# Patient Record
Sex: Male | Born: 1935 | Race: White | Hispanic: No | Smoking: Former smoker
Health system: Southern US, Community
[De-identification: ages and names within clinical notes are randomized; demographics above are authoritative.]

## PROBLEM LIST (undated history)

## (undated) DIAGNOSIS — E7800 Pure hypercholesterolemia, unspecified: Secondary | ICD-10-CM

## (undated) DIAGNOSIS — I4891 Unspecified atrial fibrillation: Secondary | ICD-10-CM

## (undated) DIAGNOSIS — N183 Chronic kidney disease, stage 3 unspecified: Secondary | ICD-10-CM

## (undated) DIAGNOSIS — R918 Other nonspecific abnormal finding of lung field: Secondary | ICD-10-CM

## (undated) HISTORY — DX: Unspecified atrial fibrillation: I48.91

## (undated) HISTORY — DX: Pure hypercholesterolemia, unspecified: E78.00

## (undated) HISTORY — DX: Other nonspecific abnormal finding of lung field: R91.8

## (undated) HISTORY — DX: Chronic kidney disease, stage 3 unspecified: N18.30

## (undated) HISTORY — DX: Chronic kidney disease, stage 3 (moderate): N18.3

---
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
## 2014-10-09 ENCOUNTER — Encounter: Payer: Self-pay | Admitting: Cardiology

## 2014-10-30 ENCOUNTER — Other Ambulatory Visit: Payer: Self-pay | Admitting: Geriatric Medicine

## 2014-10-30 DIAGNOSIS — R918 Other nonspecific abnormal finding of lung field: Secondary | ICD-10-CM

## 2014-11-03 ENCOUNTER — Ambulatory Visit
Admission: RE | Admit: 2014-11-03 | Discharge: 2014-11-03 | Disposition: A | Discharge status: Home / Self Care | Payer: Medicare Other | Source: Ambulatory Visit | Attending: Geriatric Medicine | Admitting: Geriatric Medicine

## 2014-11-03 DIAGNOSIS — R918 Other nonspecific abnormal finding of lung field: Secondary | ICD-10-CM

## 2014-11-07 ENCOUNTER — Encounter: Payer: Self-pay | Admitting: Pulmonary Disease

## 2014-11-10 ENCOUNTER — Encounter: Payer: Self-pay | Admitting: Pulmonary Disease

## 2014-11-10 ENCOUNTER — Ambulatory Visit (INDEPENDENT_AMBULATORY_CARE_PROVIDER_SITE_OTHER): Payer: Medicare Other | Admitting: Pulmonary Disease

## 2014-11-10 ENCOUNTER — Other Ambulatory Visit (INDEPENDENT_AMBULATORY_CARE_PROVIDER_SITE_OTHER): Payer: Medicare Other

## 2014-11-10 VITALS — BP 120/64 | HR 55 | Temp 97.6°F | Ht 69.0 in | Wt 168.2 lb

## 2014-11-10 DIAGNOSIS — R339 Retention of urine, unspecified: Secondary | ICD-10-CM | POA: Diagnosis not present

## 2014-11-10 DIAGNOSIS — J984 Other disorders of lung: Secondary | ICD-10-CM | POA: Diagnosis not present

## 2014-11-10 DIAGNOSIS — R918 Other nonspecific abnormal finding of lung field: Secondary | ICD-10-CM | POA: Insufficient documentation

## 2014-11-10 LAB — CREATININE, SERUM: Creatinine, Ser: 1.58 mg/dL — ABNORMAL HIGH (ref 0.40–1.50)

## 2014-11-10 LAB — BUN: BUN: 26 mg/dL — ABNORMAL HIGH (ref 6–23)

## 2014-11-10 NOTE — Patient Instructions (Signed)
1. Checking a whole-body PET/CT scan to evaluate for possible sources of cancer. 2. Checking a serum PSA for a possible prostate cancer 3. Checking urine studies for possible fungal infections from where you've lived 4. I will contact your son with the results of your studies. He will return to clinic in 4 weeks or sooner if needed.

## 2014-11-10 NOTE — Progress Notes (Signed)
Subjective:    Patient ID: Ronald Ochoa, male    DOB: 12/21/1920, 81 y.o.   MRN: [id number]  HPI He reports he has never been told he had any discussion about an abnormal CXR.  Reportedly he had a CXR as a screening annual exam prior to coming to [place] to [place] and was found to have an abnormal exam.  He moved to [facility] before a biopsy could be performed.  He denies any chest pain or discomfort.  He reports feeling "dizzy" on standing.  Low blood pressure has been a problem and he is on Florinef. He denies any dyspnea or coughing. He denies any fever, chills, or sweats. He may have lost 7 pounds since May of this year. He reports early satiety but denies any odynophagia or dysphagia. No nausea, emesis or abdominal pain.  No melena or hematochezia. No adenopathy in his neck, groin, or axilla. He denies any headaches. No focal vision loss, weakness, numbness, or tingling.    Review of Systems He denies urinary hesitancy, dysuria or hematuria but does go frequently. No rashes but does bruise easily, particularly on his hands. A pertinent 14 point review of systems is negative except as per the history of presenting illness.  No Known Allergies  Current Outpatient Prescriptions on File Prior to Visit  Medication Sig Dispense Refill  . amiodarone (PACERONE) 200 MG tablet Take 200 mg by mouth daily.    . fludrocortisone (FLORINEF) 0.1 MG tablet Take 0.1 mg by mouth daily.    . pravastatin (PRAVACHOL) 20 MG tablet Take 20 mg by mouth daily.     No current facility-administered medications on file prior to visit.   Past Medical History  Diagnosis Date  . Atrial fibrillation   . Hypercholesterolemia   . Chronic renal failure, stage 3 (moderate)   . Multiple lung nodules on CT    Past Surgical History  Procedure Laterality Date  . Appendectomy     Family History  Problem Relation Age of Onset  . CAD Father   . Cancer Mother   . Colon cancer Sister   . Liver cancer Daughter   .  Hepatitis C Daughter   . Melanoma Son    History   Social History  . Marital Status: Widowed    Spouse Name: N/A  . Number of Children: Y  . Years of Education: N/A   Occupational History  . retired    Social History Main Topics  . Smoking status: Former Smoker -- 0.50 packs/day for 15 years    Types: Cigarettes    Quit date: 04/18/1953  . Smokeless tobacco: Not on file  . Alcohol Use: No  . Drug Use: No  . Sexual Activity: Not on file   Other Topics Concern  . None   Social History Narrative   Originally from [place], [place]. He has lived in [place], [place], [place], [place],  & [place]. Has previously traveled to [place], [place], [place], & [place]. 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 He currently lives at [facility]. Previously had dogs. He denies any bird or mold exposure. No hot tub exposure. 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        Objective:   Physical Exam Blood pressure 120/64, pulse 55, temperature 97.6 F (36.4 C), temperature source Oral, height '5\' 9"'$  (1.753 m), weight 168 lb 3.2 oz (76.295 kg), SpO2 97 %. General:  Awake. Alert. No acute distress. Accompanied by son  today.  Integument:  Warm & dry. No rash on exposed skin. Mild bruising on dorsum of bilateral hands. Lymphatics:  No appreciated cervical or supraclavicular lymphadenoapthy. HEENT:  Moist mucus membranes. No oral ulcers. No scleral injection or icterus. PERRL. Cardiovascular:  Regular rate. No edema. No appreciable JVD.  Pulmonary:  Good aeration & clear to auscultation bilaterally. Symmetric chest wall expansion. No accessory muscle use. Abdomen: Soft. Normal bowel sounds. Nondistended. Grossly nontender. Musculoskeletal:  Normal bulk and tone. Hand grip strength 5/5 bilaterally. No joint deformity or effusion appreciated. Neurological:  CN 2-12 grossly in tact. No meningismus. Moving all 4 extremities equally. Symmetric patellar deep tendon  reflexes. Psychiatric:  Mood and affect congruent. Speech normal rhythm, rate & tone.   IMAGING CT CHEST W/O 11/03/14 (personally reviewed by me): Spiculated left upper lobe nodule and right upper lobe mass with extension to the right lower lobe. There are also bronchograms noted within the opacification suggesting some element of consolidation. Other pulmonary nodules noted bilaterally. Incompletely imaged right adrenal nodule. No appreciable pathologic mediastinal adenopathy. No pleural effusion appreciated. Left upper lobe nodule does extend to the pleura. No lytic bone lesions appreciated.    Assessment & Plan:  81 year old Caucasian male with bilateral pulmonary nodules and a right upper lobe mass extending into the right lower lobe. There is some consolidation to the right upper lobe/lower lobe mass as well with air bronchograms present. I did spend a significant amount from today reviewing the chest CT image with the patient and his son who was also present. We also spent a lengthy amount of time discussing the possible etiologies for his lung nodule/mass including Histoplasma, Blastomyces, and metastatic cancer. I do not favor of primary lung malignancy given the lack of pathologic mediastinal adenopathy. Certainly malignancy is of high probability given his advanced age and multiple family members with various malignancies. The patient expressed the wish that his son be contacted with the results first to assist in helping him make further decisions. I attempted to reassure the patient that we will not perform any procedures that he does not expressly consent to.  1. Lung nodules/right upper lobe mass: Checking urine Histoplasma and Blastomyces antigens. Also checking serum PSA. Checking PET CT scan to evaluate for possible primary source of malignancy and biopsy site. 2. Urinary frequency: Checking total and free PSA. 3. Follow-up: Patient will return to clinic in 4 weeks or sooner if needed.

## 2014-11-11 LAB — PSA, TOTAL AND FREE
PSA, Free Pct: 28 % (ref >25)
PSA, Free: 0.34 ng/mL
PSA: 1.21 ng/mL (ref <=4.00)

## 2014-11-19 ENCOUNTER — Ambulatory Visit (HOSPITAL_COMMUNITY)
Admission: RE | Admit: 2014-11-19 | Discharge: 2014-11-19 | Disposition: A | Discharge status: Home / Self Care | Payer: Medicare Other | Source: Ambulatory Visit | Attending: Pulmonary Disease | Admitting: Pulmonary Disease

## 2014-11-19 DIAGNOSIS — R918 Other nonspecific abnormal finding of lung field: Secondary | ICD-10-CM | POA: Diagnosis not present

## 2014-11-19 DIAGNOSIS — Z79899 Other long term (current) drug therapy: Secondary | ICD-10-CM | POA: Diagnosis not present

## 2014-11-19 DIAGNOSIS — C801 Malignant (primary) neoplasm, unspecified: Secondary | ICD-10-CM | POA: Insufficient documentation

## 2014-11-19 DIAGNOSIS — I700 Atherosclerosis of aorta: Secondary | ICD-10-CM | POA: Insufficient documentation

## 2014-11-19 DIAGNOSIS — C7801 Secondary malignant neoplasm of right lung: Secondary | ICD-10-CM | POA: Insufficient documentation

## 2014-11-19 DIAGNOSIS — C7951 Secondary malignant neoplasm of bone: Secondary | ICD-10-CM | POA: Diagnosis not present

## 2014-11-19 DIAGNOSIS — C7971 Secondary malignant neoplasm of right adrenal gland: Secondary | ICD-10-CM | POA: Insufficient documentation

## 2014-11-19 DIAGNOSIS — C7802 Secondary malignant neoplasm of left lung: Secondary | ICD-10-CM | POA: Diagnosis not present

## 2014-11-19 LAB — GLUCOSE, CAPILLARY: GLUCOSE-CAPILLARY: 78 mg/dL (ref 65–99)

## 2014-11-19 LAB — HISTOPLASMA ANTIGEN, URINE: Histoplasma Antigen, urine: <0.5 ng/mL

## 2014-11-19 MED ORDER — FLUDEOXYGLUCOSE F - 18 (FDG) INJECTION
9.2000 | Freq: Once | INTRAVENOUS | Status: AC | PRN
  Administered 2014-11-19: 9.2 via INTRAVENOUS

## 2014-11-21 ENCOUNTER — Telehealth: Payer: Self-pay | Admitting: Pulmonary Disease

## 2014-11-21 NOTE — Telephone Encounter (Signed)
Spoke with the patient's son Ronald Ochoa today regarding the results of the PET CT scan. This appears to be a widely metastatic malignancy on PET/CT imaging involving not only the lumbar vertebral spine but also a lesion within the abdomen and multiple hypermetabolic areas within the chest. We had a very frank discussion about the patient's potential survival and curability if this is truly cancer and the patient's son does not feel that he would wish to be aggressive. If the patient is not willing to consider even palliative chemotherapy I do not feel that subjecting him to a biopsy and the potential comorbidities and complications of biopsy is necessary. The patient's son is going to discuss this further with him and then contact my office regarding his wishes. We did briefly discuss performing a brain MRI to evaluate for possible cerebral metastases not appreciated on PET/CT imaging. We also discussed palliative radiation therapy for symptomatic relief. Presently he does have an appointment scheduled in September but will let us know if his father wishes to move this appointment forward to discuss the results and potential options.

## 2014-11-25 LAB — MVISTA BLASTOMYCES QNT AG, URINE
Interpretation: NEGATIVE
Result (ng/ml): NOT DETECTED

## 2014-11-26 ENCOUNTER — Telehealth: Payer: Self-pay | Admitting: Pulmonary Disease

## 2014-11-26 DIAGNOSIS — C7951 Secondary malignant neoplasm of bone: Secondary | ICD-10-CM

## 2014-11-26 NOTE — Telephone Encounter (Signed)
Patients son spoke with Dr. Ashok Cordia regarding PET scan.  Patients son said that they have decided that they would like to go ahead with a referral for radiation consultation for the spine in order to help with comfort in the spine.  Patient's son wants to know if he still needs to bring father back on 9/1.

## 2014-11-26 NOTE — Telephone Encounter (Signed)
Called spoke with son and is aware of below. Referral placed. They will keep appt for now. Nothing further needed

## 2014-11-26 NOTE — Telephone Encounter (Signed)
Please put in a consult to Radiation Oncology for possible palliative treatment to spinal metastasis. As far as his appointment on 9/1 please call the patient's son and tell him I'd be happy to see him back on an as needed basis if they'd prefer. If if they want to keep the appointment on 9/1 to answer any questions his father may have after they meet with Radiation Oncology I'm fine with that too. Thanks.

## 2014-12-01 ENCOUNTER — Ambulatory Visit: Payer: Self-pay | Admitting: Cardiology

## 2014-12-02 ENCOUNTER — Ambulatory Visit (INDEPENDENT_AMBULATORY_CARE_PROVIDER_SITE_OTHER): Payer: Medicare Other | Admitting: Cardiology

## 2014-12-02 ENCOUNTER — Encounter: Payer: Self-pay | Admitting: Cardiology

## 2014-12-02 VITALS — BP 140/64 | HR 55 | Ht 66.0 in | Wt 165.5 lb

## 2014-12-02 DIAGNOSIS — R011 Cardiac murmur, unspecified: Secondary | ICD-10-CM | POA: Diagnosis not present

## 2014-12-02 DIAGNOSIS — Z79899 Other long term (current) drug therapy: Secondary | ICD-10-CM | POA: Diagnosis not present

## 2014-12-02 DIAGNOSIS — I480 Paroxysmal atrial fibrillation: Secondary | ICD-10-CM

## 2014-12-02 DIAGNOSIS — I951 Orthostatic hypotension: Secondary | ICD-10-CM

## 2014-12-02 MED ORDER — AMIODARONE HCL 200 MG PO TABS: 200.0000 mg | ORAL_TABLET | Freq: Every day | ORAL | Status: AC

## 2014-12-02 NOTE — Patient Instructions (Signed)
Medication Instructions:  Your physician recommends that you continue on your current medications as directed. Please refer to the Current Medication list given to you today.  Follow-Up: Follow up in 6 months with Dr. Marlou Porch.  You will receive a letter in the mail 2 months before you are due.  Please call us when you receive this letter to schedule your follow up appointment.  Thank you for choosing [facility]!!

## 2014-12-02 NOTE — Progress Notes (Signed)
Cardiology Office Note   Date:  12/02/2014   ID:  Ronald Ochoa, DOB 1921-02-02, MRN [id number]  PCP:  [name], MD  Cardiologist:   [name], MD       History of Present Illness: Ronald Ochoa is a 81 y.o. male who presents for evaluation of atrial fibrillation, paroxysmal. Has been taking amiodarone for several years. Also has orthostatic hypotension for which he takes Florinef. Has a lung nodule.  Hemoglobin 12.2, creatinine 1.5, BUN 27, sodium 144, potassium 4.1, ALT 15, LDL 64, TSH 1.2  He moved [facility] [place] from [facility]. His son [name] lives in [place]. He has been on amiodarone for quite some time every now and then forgets to take the pill. Has occasional ankle edema. He lives at [facility]    Past Medical History  Diagnosis Date  . Atrial fibrillation   . Hypercholesterolemia   . Chronic renal failure, stage 3 (moderate)   . Multiple lung nodules on CT     Past Surgical History  Procedure Laterality Date  . Appendectomy       Current Outpatient Prescriptions  Medication Sig Dispense Refill  . amiodarone (PACERONE) 200 MG tablet Take 200 mg by mouth daily.    . fludrocortisone (FLORINEF) 0.1 MG tablet Take 0.1 mg by mouth daily.    . pravastatin (PRAVACHOL) 20 MG tablet Take 20 mg by mouth daily.     No current facility-administered medications for this visit.    Allergies:   Review of patient's allergies indicates no known allergies.    Social History:  The patient  reports that he quit smoking about 61 years ago. His smoking use included Cigarettes. He has a 7.5 pack-year smoking history. He does not have any smokeless tobacco history on file. He reports that he does not drink alcohol or use illicit drugs.   Family History:  The patient's family history includes CAD in his father; Cancer in his mother; Colon cancer in his sister; Hepatitis C in his daughter; Liver cancer in his daughter; Melanoma in his son.    ROS:   Please see the history of present illness.   Otherwise, review of systems are positive for unsteady gait, occasional fatigue. Denies chest pain, orthopnea, syncope, bleeding.  All other systems are reviewed and negative.    PHYSICAL EXAM: VS:  BP 140/64 mmHg  Pulse 55  Ht '5\' 6"'$  (1.676 m)  Wt 165 lb 8 oz (75.07 kg)  BMI 26.73 kg/m2 , BMI Body mass index is 26.73 kg/(m^2). GEN: Elderly, ambulates with a walker in no acute distress HEENT: normal Neck: no JVD, carotid bruits, or masses Cardiac: RRR; 2/6 systolic murmur right upper sternal border, no rubs, or gallops,no edema  Respiratory:  clear to auscultation bilaterally, normal work of breathing GI: soft, nontender, nondistended, + BS MS: no deformity or atrophy Skin: warm and dry, no rash Neuro:  Strength and sensation are intact Psych: euthymic mood, full affect   EKG:  EKG is ordered today. The ekg ordered today demonstrates today, 12/02/14-sinus bradycardia rate 55 with first-degree AV block, PR interval 226 ms   Recent Labs: 11/10/2014: BUN 26*; Creatinine, Ser 1.58*    Lipid Panel No results found for: CHOL, TRIG, HDL, CHOLHDL, VLDL, LDLCALC, LDLDIRECT    Wt Readings from Last 3 Encounters:  12/02/14 165 lb 8 oz (75.07 kg)  11/10/14 168 lb 3.2 oz (76.295 kg)      Other studies Reviewed: Additional studies/ records that were reviewed today include:  Office notes, lab work, EKG reviewed. Review of the above records demonstrates: As above   ASSESSMENT AND PLAN:  1.  Paroxysmal atrial fibrillation  - CHADS-VASc at least 2 (age). No prior stroke. He is not hypertensive.  - He has not had any recent episodes of atrial fibrillation. His prior cardiologist in [facility] has been maintaining sinus rhythm with amiodarone 200 mg a day. We will continue with this. We discussed the possibility of anticoagulation and at this time we will forego. Understands the stroke risk if atrial fibrillation were to return. He is not  interested at this time on anticoagulation. He is at increased risk for falls. Increased risk for bleeding given advanced age. His son was present for discussion, [name].  2. Orthostatic hypotension  - Currently on Florinef. Doing well.  - He knows to get up slowly  3. Chronic amiodarone use  - Dr. Felipa Eth sent me his lab work from 09/11/14 including LFTs, TSH all of which were normal. Reassurance. No evidence of pulmonary fibrosis.  4. Heart murmur  - Systolic murmur appreciated. Had lengthy discussion with he and son. We will continue to monitor clinically instead of pursuing echocardiogram as this will not likely change management at this point.   Current medicines are reviewed at length with the patient today.  The patient does not have concerns regarding medicines.  The following changes have been made:  no change  Labs/ tests ordered today include: None  No orders of the defined types were placed in this encounter.     Disposition:   FU with [facility] in 6 months  Signed, [name], MD  12/02/2014 12:16 PM    [facility] [place], [place], [place]  [address] Phone: [phone]; Fax: [phone]

## 2014-12-03 NOTE — Progress Notes (Addendum)
Histology and Location of Primary Cancer:Bilateral pulmonary nodules and right upper lobe mass  11/03/14:anterior osteophytosis in the thoracic spine.    PET 8/IMPRESSION: 1. Central right midlung mass is intensely hypermetabolic compatible with primary bronchogenic carcinoma. Assuming a non-small still histology this would be consistent with a T2bN0M1b or stage IV disease. 2. Bilateral pulmonary metastasis 3. Right adrenal gland metastasis 4. Lytic metastasis to the L2 vertebra.3/16    CT Chest IMPRESSION: 1. Right upper lobe mass right upper/lower lobe mass and spiculated left upper lobe nodule, highly worrisome for synchronous primary bronchogenic carcinomas. 2. Multiple scattered bilateral pulmonary nodules may be metastatic. 3. Right adrenal nodule is incompletely imaged and worrisome for metastatic disease. 4. Extensive 3 vessel coronary artery calcification.  Sites of Visceral and Bony Metastatic Disease:   Location(s) of Symptomatic Metastases:   Past/Anticipated chemotherapy by medical oncology, if any:   Pain on a scale of 0-10 is: No   If Spine Met(s), symptoms, if any, include:  Bowel/Bladder retention or incontinence :No  Numbness or weakness in extremities No  Current Decadron regimen, if applicable:No   Ambulatory status? Walker? Wheelchair?: Uses walker Quit smoking on 04/18/1953 after 15 year history 1/2 ppd over 15 years. No alcohol.  SAFETY ISSUES:  Prior radiation? No  Pacemaker/ICD? No  Possible current pregnancy? N/A  Is the patient on methotrexate? No  Current Complaints / other details:single.Resides at [facility].Retired from [employer].Here with son [name].  BP 149/53 mmHg  Pulse 55  Temp(Src) 98.1 F (36.7 C)  Resp 20  Wt 164 lb 6.4 oz (74.571 kg)  SpO2 98%

## 2014-12-04 ENCOUNTER — Ambulatory Visit
Admission: RE | Admit: 2014-12-04 | Discharge: 2014-12-04 | Disposition: A | Discharge status: Home / Self Care | Payer: Medicare Other | Source: Ambulatory Visit | Attending: Radiation Oncology | Admitting: Radiation Oncology

## 2014-12-04 ENCOUNTER — Encounter: Payer: Self-pay | Admitting: Radiation Oncology

## 2014-12-04 VITALS — BP 149/53 | HR 55 | Temp 98.1°F | Resp 20 | Wt 164.4 lb

## 2014-12-04 DIAGNOSIS — M545 Low back pain: Secondary | ICD-10-CM | POA: Diagnosis not present

## 2014-12-04 DIAGNOSIS — Z7982 Long term (current) use of aspirin: Secondary | ICD-10-CM | POA: Diagnosis not present

## 2014-12-04 DIAGNOSIS — C3490 Malignant neoplasm of unspecified part of unspecified bronchus or lung: Secondary | ICD-10-CM | POA: Diagnosis present

## 2014-12-04 DIAGNOSIS — R918 Other nonspecific abnormal finding of lung field: Secondary | ICD-10-CM

## 2014-12-04 NOTE — Progress Notes (Signed)
Please see the Nurse Progress Note in the MD Initial Consult Encounter for this patient. 

## 2014-12-04 NOTE — Progress Notes (Signed)
Radiation Oncology         [phone]) [phone] ________________________________  Initial outpatient Consultation - Date: 12/04/2014   Name: Ronald Ochoa MRN: [id number]   DOB: 1920/04/29  REFERRING PHYSICIAN: Javier Glazier, MD  DIAGNOSIS AND STAGE: 81 yo gentleman with presumed metastatic non small cell lung cancer.  HISTORY OF PRESENT ILLNESS::Ronald Ochoa is a 81 y.o. male who moved here to be closer to his son. Upon moving into his assisted living facility he had a TB test that was positive. This prompted a chest X-ray which showed an abnormality in the right lung. This prompted a chest CT which confirmed a 6 cm right lung mass, as well as multiple other bilateral lung nodules concerning for metastatic disease. A PET scan was performed on 11/19/14 which showed uptake in the right lung mass as well as in the other lung nodules and in the bilateral adrenal glands. A hypermetabolic lytic lesion was seen in the L2 region. After discussion with Dr. Ashok Cordia the patient and his son decided not to proceed with biopsy. He was referred to me with consideration of palliative radiation to this area of bony disease. He lives in assisted here in [place]. Per his son, his short term memory is poor. He does perform most of his activities of daily living. He walks with a walker. He denies any hemoptysis or chest pain. His breathing symptoms are stable. He presents with new onset low back pain. This does not interfere with sleep or movement. He currently takes aspirin which controls this pain.  Past medical, social and family history were reviewed in the electronic chart. Review of symptoms was reviewed in the electronic chart. Medications were reviewed in the electronic chart.   PHYSICAL EXAM:  Filed Vitals:   12/04/14 1039  BP: 149/53  Pulse: 55  Temp: 98.1 F (36.7 C)  Resp: 20  .164 lb 6.4 oz (74.571 kg). Pleasant elderly male in no distress. He has tenderness to palpation over the L2 vertebral body.  He has no focal weakness of his upper or lower extremities. His cranial nerves are intact.    IMPRESSION: Likely metastatic non small cell lung cancer  PLAN: I spoke to the patient and his son today. He has declined biopsy, which I think is reasonable. He is currently having minimal pain at L2 which he controls with aspirin. He is able to sleep at night and it does not interfere with movement. Given his short term memory issues and general medical function as well as his lack of severe pain, I think I would hold off now on treatment at this time. Radiation treatment to this area could cause nausea and vomiting, as well as possible diarrhea which he may not tolerate well. In addition mobility seems to be an issue and daily radiation treatment may take its toll on him. I have asked his son to keep track of his pain levels and report back to me if his pain is no longer controlled by aspirin. At this point we can initiate with radiation. He has follow up scheduled with Dr. Ashok Cordia September 1st. I will plan on seeing him back on an as needed basis.    I spent 40 minutes  face to face with the patient and more than 50% of that time was spent in counseling and/or coordination of care.   This document serves as a record of services personally performed by Thea Silversmith, MD. It was created on her behalf by Arlyce Harman, a trained medical scribe.  The creation of this record is based on the scribe's personal observations and the provider's statements to them. This document has been checked and approved by the attending provider.    ------------------------------------------------  Thea Silversmith, MD

## 2014-12-18 ENCOUNTER — Ambulatory Visit: Payer: PRIVATE HEALTH INSURANCE | Admitting: Pulmonary Disease

## 2015-06-25 ENCOUNTER — Encounter: Payer: Self-pay | Admitting: Cardiology

## 2015-06-25 ENCOUNTER — Ambulatory Visit (INDEPENDENT_AMBULATORY_CARE_PROVIDER_SITE_OTHER): Payer: PPO | Admitting: Cardiology

## 2015-06-25 VITALS — BP 144/68 | HR 58 | Ht 69.0 in | Wt 151.4 lb

## 2015-06-25 DIAGNOSIS — Z79899 Other long term (current) drug therapy: Secondary | ICD-10-CM | POA: Diagnosis not present

## 2015-06-25 DIAGNOSIS — I480 Paroxysmal atrial fibrillation: Secondary | ICD-10-CM

## 2015-06-25 NOTE — Patient Instructions (Signed)
Medication Instructions:  The current medical regimen is effective;  continue present plan and medications.  Follow-Up: Follow up as needed with Dr Marlou Porch.  If you need a refill on your cardiac medications before your next appointment, please call your pharmacy.  Thank you for choosing [facility]!!

## 2015-06-25 NOTE — Progress Notes (Signed)
Cardiology Office Note   Date:  06/25/2015   ID:  Ronald Ochoa, DOB 1920-05-18, MRN [id number]  PCP:  Ronald Argyle, MD  Cardiologist:   Ronald Furbish, MD       History of Present Illness: Ronald Ochoa is a 82 y.o. male who presents for evaluation of atrial fibrillation, paroxysmal. Has been taking amiodarone for several years. Also has orthostatic hypotension for which he takes Florinef. Has a lung nodule.  Overall no change since last visit. He has been maintaining sinus rhythm with amiodarone for several years it sounds like. Not interested in anticoagulation.  Hemoglobin 12.2, creatinine 1.5, BUN 27, sodium 144, potassium 4.1, ALT 15, LDL 64, TSH 1.2, previously  [place] from [place]. His son Ronald Ochoa lives in [place]. He has been on amiodarone for quite some time now  He lives at [facility]    Past Medical History  Diagnosis Date  . Atrial fibrillation ([place]cho[place])   . Hypercholesterolemia   . Chronic renal failure, stage 3 (moderate)   . Multiple lung nodules on CT     Past Surgical History  Procedure Laterality Date  . Appendectomy  1943     Current Outpatient Prescriptions  Medication Sig Dispense Refill  . amiodarone (PACERONE) 200 MG tablet Take 1 tablet (200 mg total) by mouth daily. 90 tablet 3  . fludrocortisone (FLORINEF) 0.1 MG tablet Take 0.1 mg by mouth daily.    . pravastatin (PRAVACHOL) 20 MG tablet Take 20 mg by mouth daily.     No current facility-administered medications for this visit.    Allergies:   Review of patient's allergies indicates no known allergies.    Social History:  The patient  reports that he quit smoking about 62 years ago. His smoking use included Cigarettes. He has a 7.5 pack-year smoking history. He does not have any smokeless tobacco history on file. He reports that he does not drink alcohol or use illicit drugs.   Family History:  The patient's family history includes CAD in his father;  Cancer in his mother; Colon cancer in his sister; Hepatitis C in his daughter; Liver cancer in his daughter; Melanoma in his son.    ROS:  Please see the history of present illness.   Otherwise, review of systems are positive for unsteady gait, occasional fatigue. Denies chest pain, orthopnea, syncope, bleeding.  All other systems are reviewed and negative.    PHYSICAL EXAM: VS:  BP 144/68 mmHg  Pulse 58  Ht '5\' 9"'$  (1.753 m)  Wt 151 lb 6.4 oz (68.675 kg)  BMI 22.35 kg/m2 , BMI Body mass index is 22.35 kg/(m^2). GEN: Elderly, ambulates with a walker in no acute distressThin HEENT: normal Neck: no JVD, carotid bruits, or masses Cardiac: RRR; 2/6 systolic murmur right upper sternal border, no rubs, or gallops,no edema  Respiratory:  clear to auscultation bilaterally, normal work of breathing GI: soft, nontender, nondistended, + BS MS: no deformity or atrophy Skin: warm and dry, no rash Neuro:  Strength and sensation are intact Psych: euthymic mood, full affect   EKG:   12/02/14-sinus bradycardia rate 55 with first-degree AV block, PR interval 226 ms   Recent Labs: 11/10/2014: BUN 26*; Creatinine, Ser 1.58*    Lipid Panel No results found for: CHOL, TRIG, HDL, CHOLHDL, VLDL, LDLCALC, LDLDIRECT    Wt Readings from Last 3 Encounters:  06/25/15 151 lb 6.4 oz (68.675 kg)  12/04/14 164 lb 6.4 oz (74.571 kg)  12/02/14 165 lb 8  oz (75.07 kg)      Other studies Reviewed: Additional studies/ records that were reviewed today include: Office notes, lab work, EKG reviewed. Review of the above records demonstrates: As above   ASSESSMENT AND PLAN:  1.  Paroxysmal atrial fibrillation  - CHADS-VASc at least 2 (age). No prior stroke. He is not hypertensive.  - He has not had any recent episodes of atrial fibrillation. His prior cardiologist in [facility] has been maintaining sinus rhythm with amiodarone 200 mg a day. We will continue with this. We discussed the possibility of  anticoagulation and at this time we will forego. Understands the stroke risk if atrial fibrillation were to return. He is not interested at this time on anticoagulation. He is at increased risk for falls. Increased risk for bleeding given advanced age. His son was present for discussion, Ronald Ochoa.  2. Orthostatic hypotension  - Currently on Florinef. Doing well.  - He knows to get up slowly  3. Chronic amiodarone use  - Dr. Felipa Ochoa Watching lab work from including LFTs, TSH all of which were normal. Reassurance. No evidence of pulmonary fibrosis.  4. Heart murmur  - Systolic murmur appreciated. Had lengthy discussion with he and son. We will continue to monitor clinically instead of pursuing echocardiogram as this will not likely change management at this point.  At this point his son would like to just maintain follow-up with Dr. Felipa Ochoa which seems reasonable. I explained to him that if Dr. Carlyle Ochoa office did not wish to continue to prescribe amiodarone that he would need to come back on a yearly basis at least to have his checkup.   Current medicines are reviewed at length with the patient today.  The patient does not have concerns regarding medicines.  The following changes have been made:  no change  Labs/ tests ordered today include: None  No orders of the defined types were placed in this encounter.     Disposition:   FU with Ronald Ochoa in PRN basis.   Ronald Rumpf, MD  06/25/2015 11:04 AM    [address], [place], [place]  [address] Phone: [phone]; Fax: [phone]

## 2015-08-08 ENCOUNTER — Inpatient Hospital Stay (HOSPITAL_COMMUNITY): Payer: PPO | Admitting: Anesthesiology

## 2015-08-08 ENCOUNTER — Inpatient Hospital Stay (HOSPITAL_COMMUNITY): Payer: PPO

## 2015-08-08 ENCOUNTER — Emergency Department (HOSPITAL_COMMUNITY): Payer: PPO

## 2015-08-08 ENCOUNTER — Encounter (HOSPITAL_COMMUNITY)
Admission: EM | Disposition: A | Discharge status: Skilled Nursing Facility | Payer: Self-pay | Source: Home / Self Care | Attending: Internal Medicine

## 2015-08-08 ENCOUNTER — Encounter (HOSPITAL_COMMUNITY): Payer: Self-pay | Admitting: Emergency Medicine

## 2015-08-08 ENCOUNTER — Inpatient Hospital Stay (HOSPITAL_COMMUNITY)
Admission: EM | Admit: 2015-08-08 | Discharge: 2015-08-10 | DRG: 470 | Disposition: A | Discharge status: Skilled Nursing Facility | Payer: PPO | Attending: Internal Medicine | Admitting: Internal Medicine

## 2015-08-08 DIAGNOSIS — S72002A Fracture of unspecified part of neck of left femur, initial encounter for closed fracture: Secondary | ICD-10-CM

## 2015-08-08 DIAGNOSIS — S72009A Fracture of unspecified part of neck of unspecified femur, initial encounter for closed fracture: Secondary | ICD-10-CM | POA: Diagnosis present

## 2015-08-08 DIAGNOSIS — D649 Anemia, unspecified: Secondary | ICD-10-CM

## 2015-08-08 DIAGNOSIS — C7951 Secondary malignant neoplasm of bone: Secondary | ICD-10-CM | POA: Diagnosis present

## 2015-08-08 DIAGNOSIS — D62 Acute posthemorrhagic anemia: Secondary | ICD-10-CM | POA: Diagnosis not present

## 2015-08-08 DIAGNOSIS — C7971 Secondary malignant neoplasm of right adrenal gland: Secondary | ICD-10-CM | POA: Diagnosis present

## 2015-08-08 DIAGNOSIS — F0390 Unspecified dementia without behavioral disturbance: Secondary | ICD-10-CM | POA: Diagnosis present

## 2015-08-08 DIAGNOSIS — C7801 Secondary malignant neoplasm of right lung: Secondary | ICD-10-CM | POA: Diagnosis present

## 2015-08-08 DIAGNOSIS — Z66 Do not resuscitate: Secondary | ICD-10-CM | POA: Diagnosis present

## 2015-08-08 DIAGNOSIS — N183 Chronic kidney disease, stage 3 unspecified: Secondary | ICD-10-CM | POA: Diagnosis present

## 2015-08-08 DIAGNOSIS — C3490 Malignant neoplasm of unspecified part of unspecified bronchus or lung: Secondary | ICD-10-CM | POA: Diagnosis not present

## 2015-08-08 DIAGNOSIS — C7802 Secondary malignant neoplasm of left lung: Secondary | ICD-10-CM | POA: Diagnosis present

## 2015-08-08 DIAGNOSIS — I4581 Long QT syndrome: Secondary | ICD-10-CM | POA: Diagnosis present

## 2015-08-08 DIAGNOSIS — M80852A Other osteoporosis with current pathological fracture, left femur, initial encounter for fracture: Principal | ICD-10-CM | POA: Diagnosis present

## 2015-08-08 DIAGNOSIS — E7800 Pure hypercholesterolemia, unspecified: Secondary | ICD-10-CM | POA: Diagnosis present

## 2015-08-08 DIAGNOSIS — Z7982 Long term (current) use of aspirin: Secondary | ICD-10-CM | POA: Diagnosis not present

## 2015-08-08 DIAGNOSIS — T148XXA Other injury of unspecified body region, initial encounter: Secondary | ICD-10-CM

## 2015-08-08 DIAGNOSIS — D696 Thrombocytopenia, unspecified: Secondary | ICD-10-CM

## 2015-08-08 DIAGNOSIS — S72002D Fracture of unspecified part of neck of left femur, subsequent encounter for closed fracture with routine healing: Secondary | ICD-10-CM | POA: Diagnosis not present

## 2015-08-08 DIAGNOSIS — Z79899 Other long term (current) drug therapy: Secondary | ICD-10-CM | POA: Diagnosis not present

## 2015-08-08 DIAGNOSIS — Z96649 Presence of unspecified artificial hip joint: Secondary | ICD-10-CM

## 2015-08-08 DIAGNOSIS — I480 Paroxysmal atrial fibrillation: Secondary | ICD-10-CM | POA: Diagnosis present

## 2015-08-08 DIAGNOSIS — I951 Orthostatic hypotension: Secondary | ICD-10-CM

## 2015-08-08 DIAGNOSIS — Z09 Encounter for follow-up examination after completed treatment for conditions other than malignant neoplasm: Secondary | ICD-10-CM

## 2015-08-08 DIAGNOSIS — C3401 Malignant neoplasm of right main bronchus: Secondary | ICD-10-CM | POA: Diagnosis present

## 2015-08-08 DIAGNOSIS — M25552 Pain in left hip: Secondary | ICD-10-CM | POA: Diagnosis present

## 2015-08-08 DIAGNOSIS — Z87891 Personal history of nicotine dependence: Secondary | ICD-10-CM | POA: Diagnosis not present

## 2015-08-08 HISTORY — PX: ANTERIOR APPROACH HEMI HIP ARTHROPLASTY: SHX6690

## 2015-08-08 LAB — CBC WITH DIFFERENTIAL/PLATELET
Basophils Absolute: 0 10*3/uL (ref 0.0–0.1)
Basophils Relative: 0 %
Eosinophils Absolute: 0.1 10*3/uL (ref 0.0–0.7)
Eosinophils Relative: 1 %
HCT: 36.4 % — ABNORMAL LOW (ref 39.0–52.0)
Hemoglobin: 11.9 g/dL — ABNORMAL LOW (ref 13.0–17.0)
Lymphocytes Relative: 11 %
Lymphs Abs: 1 10*3/uL (ref 0.7–4.0)
MCH: 30.5 pg (ref 26.0–34.0)
MCHC: 32.7 g/dL (ref 30.0–36.0)
MCV: 93.3 fL (ref 78.0–100.0)
Monocytes Absolute: 0.8 10*3/uL (ref 0.1–1.0)
Monocytes Relative: 9 %
Neutro Abs: 6.6 10*3/uL (ref 1.7–7.7)
Neutrophils Relative %: 79 %
Platelets: 142 10*3/uL — ABNORMAL LOW (ref 150–400)
RBC: 3.9 MIL/uL — ABNORMAL LOW (ref 4.22–5.81)
RDW: 14.9 % (ref 11.5–15.5)
WBC: 8.5 10*3/uL (ref 4.0–10.5)

## 2015-08-08 LAB — BASIC METABOLIC PANEL
Anion gap: 10 (ref 5–15)
BUN: 29 mg/dL — ABNORMAL HIGH (ref 6–20)
CO2: 26 mmol/L (ref 22–32)
Calcium: 8.6 mg/dL — ABNORMAL LOW (ref 8.9–10.3)
Chloride: 109 mmol/L (ref 101–111)
Creatinine, Ser: 1.44 mg/dL — ABNORMAL HIGH (ref 0.61–1.24)
GFR calc Af Amer: 47 mL/min — ABNORMAL LOW (ref >60)
GFR calc non Af Amer: 41 mL/min — ABNORMAL LOW (ref >60)
Glucose, Bld: 93 mg/dL (ref 65–99)
Potassium: 3.5 mmol/L (ref 3.5–5.1)
Sodium: 145 mmol/L (ref 135–145)

## 2015-08-08 LAB — ABO/RH: ABO/RH(D): A POS

## 2015-08-08 LAB — PREPARE RBC (CROSSMATCH)

## 2015-08-08 SURGERY — HEMIARTHROPLASTY, HIP, DIRECT ANTERIOR APPROACH, FOR FRACTURE
Anesthesia: General | Laterality: Left

## 2015-08-08 MED ORDER — ROCURONIUM BROMIDE 100 MG/10ML IV SOLN
INTRAVENOUS | Status: DC | PRN
  Administered 2015-08-08 (×2): 10 mg via INTRAVENOUS
  Administered 2015-08-08: 30 mg via INTRAVENOUS
  Administered 2015-08-08: 10 mg via INTRAVENOUS

## 2015-08-08 MED ORDER — ROCURONIUM BROMIDE 100 MG/10ML IV SOLN
INTRAVENOUS | Status: AC
  Filled 2015-08-08: qty 1

## 2015-08-08 MED ORDER — DEXAMETHASONE SODIUM PHOSPHATE 10 MG/ML IJ SOLN
INTRAMUSCULAR | Status: AC
  Filled 2015-08-08: qty 1

## 2015-08-08 MED ORDER — TRANEXAMIC ACID 1000 MG/10ML IV SOLN
1000.0000 mg | INTRAVENOUS | Status: AC
  Administered 2015-08-08: 1000 mg via INTRAVENOUS
  Filled 2015-08-08: qty 10

## 2015-08-08 MED ORDER — SUGAMMADEX SODIUM 200 MG/2ML IV SOLN
INTRAVENOUS | Status: AC
  Filled 2015-08-08: qty 2

## 2015-08-08 MED ORDER — 0.9 % SODIUM CHLORIDE (POUR BTL) OPTIME
TOPICAL | Status: DC | PRN
  Administered 2015-08-08 (×3): 1000 mL

## 2015-08-08 MED ORDER — PROPOFOL 10 MG/ML IV BOLUS
INTRAVENOUS | Status: AC
  Filled 2015-08-08: qty 20

## 2015-08-08 MED ORDER — LACTATED RINGERS IV SOLN
INTRAVENOUS | Status: DC | PRN
  Administered 2015-08-08: 20:00:00 via INTRAVENOUS

## 2015-08-08 MED ORDER — PROPOFOL 10 MG/ML IV BOLUS
INTRAVENOUS | Status: DC | PRN
  Administered 2015-08-08: 60 mg via INTRAVENOUS

## 2015-08-08 MED ORDER — ETOMIDATE 2 MG/ML IV SOLN
INTRAVENOUS | Status: AC
  Filled 2015-08-08: qty 10

## 2015-08-08 MED ORDER — LIDOCAINE HCL (CARDIAC) 20 MG/ML IV SOLN
INTRAVENOUS | Status: AC
  Filled 2015-08-08: qty 5

## 2015-08-08 MED ORDER — SUCCINYLCHOLINE CHLORIDE 20 MG/ML IJ SOLN
INTRAMUSCULAR | Status: DC | PRN
  Administered 2015-08-08: 100 mg via INTRAVENOUS

## 2015-08-08 MED ORDER — ONDANSETRON HCL 4 MG/2ML IJ SOLN
INTRAMUSCULAR | Status: DC | PRN
  Administered 2015-08-08: 4 mg via INTRAVENOUS

## 2015-08-08 MED ORDER — ONDANSETRON HCL 4 MG/2ML IJ SOLN
INTRAMUSCULAR | Status: AC
  Filled 2015-08-08: qty 2

## 2015-08-08 MED ORDER — ETOMIDATE 2 MG/ML IV SOLN
INTRAVENOUS | Status: DC | PRN
  Administered 2015-08-08: 12 mg via INTRAVENOUS

## 2015-08-08 MED ORDER — SODIUM CHLORIDE 0.9 % IV SOLN
Freq: Once | INTRAVENOUS | Status: AC
  Administered 2015-08-09: 01:00:00 via INTRAVENOUS

## 2015-08-08 MED ORDER — HYDROMORPHONE HCL 1 MG/ML IJ SOLN
0.2500 mg | INTRAMUSCULAR | Status: DC | PRN
  Administered 2015-08-08 (×2): 0.25 mg via INTRAVENOUS
  Administered 2015-08-08: 0.5 mg via INTRAVENOUS

## 2015-08-08 MED ORDER — HYDROCODONE-ACETAMINOPHEN 7.5-325 MG PO TABS: 1.0000 | ORAL_TABLET | Freq: Once | ORAL | Status: DC | PRN

## 2015-08-08 MED ORDER — FENTANYL CITRATE (PF) 100 MCG/2ML IJ SOLN
INTRAMUSCULAR | Status: AC
  Filled 2015-08-08: qty 2

## 2015-08-08 MED ORDER — ONDANSETRON HCL 4 MG/2ML IJ SOLN: 4.0000 mg | Freq: Once | INTRAMUSCULAR | Status: DC | PRN

## 2015-08-08 MED ORDER — FENTANYL CITRATE (PF) 100 MCG/2ML IJ SOLN
INTRAMUSCULAR | Status: DC | PRN
Start: 2015-08-08 — End: 2015-08-08
  Administered 2015-08-08 (×2): 25 ug via INTRAVENOUS

## 2015-08-08 MED ORDER — DEXAMETHASONE SODIUM PHOSPHATE 10 MG/ML IJ SOLN
INTRAMUSCULAR | Status: DC | PRN
  Administered 2015-08-08: 10 mg via INTRAVENOUS

## 2015-08-08 MED ORDER — BUPIVACAINE-EPINEPHRINE 0.5% -1:200000 IJ SOLN
INTRAMUSCULAR | Status: AC
  Filled 2015-08-08: qty 1

## 2015-08-08 MED ORDER — LIDOCAINE HCL (CARDIAC) 20 MG/ML IV SOLN
INTRAVENOUS | Status: DC | PRN
  Administered 2015-08-08: 100 mg via INTRAVENOUS

## 2015-08-08 MED ORDER — HYDROMORPHONE HCL 1 MG/ML IJ SOLN
INTRAMUSCULAR | Status: AC
  Administered 2015-08-08: 0.5 mg via INTRAVENOUS
  Filled 2015-08-08: qty 1

## 2015-08-08 MED ORDER — SUGAMMADEX SODIUM 200 MG/2ML IV SOLN
INTRAVENOUS | Status: DC | PRN
  Administered 2015-08-08: 200 mg via INTRAVENOUS

## 2015-08-08 MED ORDER — CEFAZOLIN SODIUM-DEXTROSE 2-3 GM-% IV SOLR
2.0000 g | Freq: Once | INTRAVENOUS | Status: AC
  Administered 2015-08-08: 2 g via INTRAVENOUS

## 2015-08-08 MED ORDER — EPHEDRINE SULFATE 50 MG/ML IJ SOLN
INTRAMUSCULAR | Status: DC | PRN
Start: 2015-08-08 — End: 2015-08-08
  Administered 2015-08-08 (×2): 10 mg via INTRAVENOUS

## 2015-08-08 MED ORDER — CEFAZOLIN SODIUM-DEXTROSE 2-4 GM/100ML-% IV SOLN
INTRAVENOUS | Status: AC
  Filled 2015-08-08: qty 100

## 2015-08-08 SURGICAL SUPPLY — 51 items
BLADE SAW SAG 73X25 THK (BLADE) ×2
BLADE SAW SGTL 73X25 THK (BLADE) ×1 IMPLANT
CAPT HIP HEMI 2 ×3 IMPLANT
CELLS DAT CNTRL 66122 CELL SVR (MISCELLANEOUS) ×1 IMPLANT
COVER SURGICAL LIGHT HANDLE (MISCELLANEOUS) ×3 IMPLANT
DRAPE INCISE IOBAN 66X45 STRL (DRAPES) ×3 IMPLANT
DRAPE ORTHO SPLIT 77X108 STRL (DRAPES)
DRAPE POUCH INSTRU U-SHP 10X18 (DRAPES) ×3 IMPLANT
DRAPE SURG ORHT 6 SPLT 77X108 (DRAPES) IMPLANT
DRAPE U-SHAPE 47X51 STRL (DRAPES) ×6 IMPLANT
DRAPE WARM FLUID 44X44 (DRAPE) IMPLANT
DRSG AQUACEL AG ADV 3.5X10 (GAUZE/BANDAGES/DRESSINGS) ×3 IMPLANT
DRSG PAD ABDOMINAL 8X10 ST (GAUZE/BANDAGES/DRESSINGS) IMPLANT
DURAPREP 26ML APPLICATOR (WOUND CARE) ×3 IMPLANT
ELECT REM PT RETURN 9FT ADLT (ELECTROSURGICAL) ×3
ELECTRODE REM PT RTRN 9FT ADLT (ELECTROSURGICAL) ×1 IMPLANT
EVACUATOR 1/8 PVC DRAIN (DRAIN) ×3 IMPLANT
FACESHIELD WRAPAROUND (MASK) IMPLANT
GAUZE SPONGE 4X4 12PLY STRL (GAUZE/BANDAGES/DRESSINGS) ×6 IMPLANT
GAUZE XEROFORM 5X9 LF (GAUZE/BANDAGES/DRESSINGS) ×3 IMPLANT
GLOVE SURG ORTHO 8.0 STRL STRW (GLOVE) ×3 IMPLANT
GLOVE SURG SS PI 8.5 STRL IVOR (GLOVE) ×2
GLOVE SURG SS PI 8.5 STRL STRW (GLOVE) ×1 IMPLANT
GOWN STRL REUS W/TWL 2XL LVL3 (GOWN DISPOSABLE) ×3 IMPLANT
GOWN STRL REUS W/TWL LRG LVL3 (GOWN DISPOSABLE) ×6 IMPLANT
KIT BASIN OR (CUSTOM PROCEDURE TRAY) IMPLANT
NEEDLE HYPO 22GX1.5 SAFETY (NEEDLE) IMPLANT
NS IRRIG 1000ML POUR BTL (IV SOLUTION) ×3 IMPLANT
PACK ANTERIOR HIP CUSTOM (KITS) ×3 IMPLANT
PASSER SUT SWANSON 36MM LOOP (INSTRUMENTS) ×3 IMPLANT
PILLOW ABDUCTION HIP (SOFTGOODS) IMPLANT
POSITIONER SURGICAL ARM (MISCELLANEOUS) ×3 IMPLANT
RTRCTR WOUND ALEXIS 18CM MED (MISCELLANEOUS) ×3
SPONGE LAP 18X18 X RAY DECT (DISPOSABLE) IMPLANT
STAPLER VISISTAT 35W (STAPLE) ×3 IMPLANT
SUCTION FRAZIER HANDLE 12FR (TUBING) ×2
SUCTION TUBE FRAZIER 12FR DISP (TUBING) ×1 IMPLANT
SUT ETHIBOND NAB CT1 #1 30IN (SUTURE) ×12 IMPLANT
SUT VIC AB 1 CT1 27 (SUTURE)
SUT VIC AB 1 CT1 27XBRD ANTBC (SUTURE) IMPLANT
SUT VIC AB 1 CTX 36 (SUTURE)
SUT VIC AB 1 CTX36XBRD ANBCTR (SUTURE) IMPLANT
SUT VIC AB 2-0 CT2 27 (SUTURE) IMPLANT
SUT VIC AB 2-0 FS1 27 (SUTURE) IMPLANT
SYR 30ML LL (SYRINGE) IMPLANT
TOWEL OR 17X26 10 PK STRL BLUE (TOWEL DISPOSABLE) ×6 IMPLANT
TRAY FOLEY W/METER SILVER 14FR (SET/KITS/TRAYS/PACK) IMPLANT
TRAY FOLEY W/METER SILVER 16FR (SET/KITS/TRAYS/PACK) ×3 IMPLANT
WATER STERILE IRR 1000ML POUR (IV SOLUTION) ×3 IMPLANT
WATER STERILE IRR 1500ML POUR (IV SOLUTION) IMPLANT
YANKAUER SUCT BULB TIP NO VENT (SUCTIONS) ×3 IMPLANT

## 2015-08-08 NOTE — ED Notes (Signed)
Per Marlou Sa NO TRANSFER pt to have surgery here.

## 2015-08-08 NOTE — Consult Note (Signed)
Reason for Consult: Left hip pain Referring Physician: Dr Genia Harold Greenawalt is an 82 y.o. male.  HPI: Ronald Ochoa is a 82 year old ambulatory patient lives alone but uses a walker.  He fell today.  Denies any loss of consciousness or any other symptoms.  He presents now for operative management of left hip fracture.  He denies any other orthopedic complaints he has a son who is with him   Past Medical History  Diagnosis Date  . Atrial fibrillation ([place])   . Hypercholesterolemia   . Chronic renal failure, stage 3 (moderate)   . Multiple lung nodules on CT     Past Surgical History  Procedure Laterality Date  . Appendectomy  1943    Family History  Problem Relation Age of Onset  . CAD Father   . Cancer Mother   . Colon cancer Sister   . Liver cancer Daughter   . Hepatitis C Daughter   . Melanoma Son     Social History:  reports that he quit smoking about 62 years ago. His smoking use included Cigarettes. He has a 7.5 pack-year smoking history. He does not have any smokeless tobacco history on file. He reports that he does not drink alcohol or use illicit drugs.  Allergies: No Known Allergies  Medications: I have reviewed the patient's current medications.  Results for orders placed or performed during the hospital encounter of 08/08/15 (from the past 48 hour(s))  Basic metabolic panel     Status: Abnormal   Collection Time: 08/08/15  5:13 PM  Result Value Ref Range   Sodium 145 135 - 145 mmol/L   Potassium 3.5 3.5 - 5.1 mmol/L   Chloride 109 101 - 111 mmol/L   CO2 26 22 - 32 mmol/L   Glucose, Bld 93 65 - 99 mg/dL   BUN 29 (H) 6 - 20 mg/dL   Creatinine, Ser 1.44 (H) 0.61 - 1.24 mg/dL   Calcium 8.6 (L) 8.9 - 10.3 mg/dL   GFR calc non Af Amer 41 (L) >60 mL/min   GFR calc Af Amer 47 (L) >60 mL/min    Comment: (NOTE) The eGFR has been calculated using the CKD EPI equation. This calculation has not been validated in all clinical situations. eGFR's persistently <60 mL/min  signify possible Chronic Kidney Disease.    Anion gap 10 5 - 15  CBC with Differential     Status: Abnormal   Collection Time: 08/08/15  5:13 PM  Result Value Ref Range   WBC 8.5 4.0 - 10.5 K/uL   RBC 3.90 (L) 4.22 - 5.81 MIL/uL   Hemoglobin 11.9 (L) 13.0 - 17.0 g/dL   HCT 36.4 (L) 39.0 - 52.0 %   MCV 93.3 78.0 - 100.0 fL   MCH 30.5 26.0 - 34.0 pg   MCHC 32.7 30.0 - 36.0 g/dL   RDW 14.9 11.5 - 15.5 %   Platelets 142 (L) 150 - 400 K/uL   Neutrophils Relative % 79 %   Neutro Abs 6.6 1.7 - 7.7 K/uL   Lymphocytes Relative 11 %   Lymphs Abs 1.0 0.7 - 4.0 K/uL   Monocytes Relative 9 %   Monocytes Absolute 0.8 0.1 - 1.0 K/uL   Eosinophils Relative 1 %   Eosinophils Absolute 0.1 0.0 - 0.7 K/uL   Basophils Relative 0 %   Basophils Absolute 0.0 0.0 - 0.1 K/uL    Dg Chest Port 1 View  08/08/2015  CLINICAL DATA:  Known left hip fracture, recent fall EXAM:  PORTABLE CHEST 1 VIEW COMPARISON:  11/19/2014 FINDINGS: Cardiac shadow is within normal limits. Aortic calcifications are noted. Right hilar mass lesion is noted consistent with the patient's known lung carcinoma. No new focal mass lesion is seen. No infiltrate is noted. Degenerative changes of thoracic spine are seen. IMPRESSION: Changes consistent with the known history of lung carcinoma. No acute abnormality is noted. Electronically Signed   By: Inez Catalina M.D.   On: 08/08/2015 17:30   Dg Hip Unilat With Pelvis 2-3 Views Left  08/08/2015  CLINICAL DATA:  Fall today with left hip pain EXAM: DG HIP (WITH OR WITHOUT PELVIS) 2-3V LEFT COMPARISON:  None. FINDINGS: Nondisplaced comminuted impacted left mid femoral neck fracture with mild apex lateral angulation. No additional fracture. No hip dislocation. Mild-to-moderate osteoarthritis in the weight-bearing portions of both hip joints. Marked degenerative changes in the visualized lower lumbar spine. IMPRESSION: Nondisplaced comminuted impacted left mid femoral neck fracture. Electronically  Signed   By: Ilona Sorrel M.D.   On: 08/08/2015 16:37    Review of Systems  Constitutional: Negative.   HENT: Negative.   Eyes: Negative.   Respiratory: Negative.   Cardiovascular: Negative.   Gastrointestinal: Negative.   Genitourinary: Negative.   Musculoskeletal: Positive for joint pain.  Skin: Negative.   Neurological: Negative.   Endo/Heme/Allergies: Negative.   Psychiatric/Behavioral: Negative.    Blood pressure 181/69, pulse 63, temperature 97.9 F (36.6 C), temperature source Oral, resp. rate 19, SpO2 97 %. Physical Exam  Constitutional: He appears well-developed.  HENT:  Head: Normocephalic.  Eyes: Pupils are equal, round, and reactive to light.  Neck: Normal range of motion.  Cardiovascular: Normal rate.   Respiratory: Effort normal.  Neurological: He is alert.  Skin: Skin is warm.  Psychiatric: He has a normal mood and affect.   left hip demonstrates slight shortening but with intact ankle dorsi and plantar flexion strength.  Pedal pulses palpable.  Some pitting edema present in the lower extremities.  Has no other pain with range of motion of the wrists elbows or shoulders.  Assessment/Plan: Impression is left angulated femoral neck fracture plan hip replacement.  May be hemiarthroplasty or total hip replacement depending on bone quality.  This and benefits discussed with the patient and family including but limited to infection or vessel damage stress of surgery, is induced heart attack stroke.  Patient does have lung carcinoma which is an added risk factor.  Nonetheless for quality of life I think surgery is indicated.  Plan use Tranxene make acid has patient is on aspirin preoperatively.  Patient extends risk benefits in the does wish to proceed all questions answered  DEAN,GREGORY SCOTT 08/08/2015, 7:17 PM

## 2015-08-08 NOTE — Anesthesia Procedure Notes (Signed)
Procedure Name: Intubation Date/Time: 08/08/2015 8:20 PM Performed by: Lind Covert Pre-anesthesia Checklist: Patient identified, Timeout performed, Emergency Drugs available, Suction available and Patient being monitored Patient Re-evaluated:Patient Re-evaluated prior to inductionOxygen Delivery Method: Circle system utilized Preoxygenation: Pre-oxygenation with 100% oxygen Intubation Type: IV induction Laryngoscope Size: Mac and 4 Grade View: Grade I Tube type: Oral Tube size: 7.5 mm Number of attempts: 1 Airway Equipment and Method: Stylet Placement Confirmation: ETT inserted through vocal cords under direct vision,  breath sounds checked- equal and bilateral and positive ETCO2 Secured at: 22 cm Tube secured with: Tape Dental Injury: Teeth and Oropharynx as per pre-operative assessment

## 2015-08-08 NOTE — Brief Op Note (Signed)
08/08/2015  11:09 PM  PATIENT:  Ronald Ochoa  82 y.o. male  PRE-OPERATIVE DIAGNOSIS:  Left hip fracture  POST-OPERATIVE DIAGNOSIS:  Left hip fracture  PROCEDURE:  Procedure(s): ANTERIOR APPROACH HEMI HIP ARTHROPLASTY  SURGEON:  Surgeon(s): [name], MD  ASSISTANT: Benjiman Core PA  ANESTHESIA:   general  EBL: 600 ml    Total I/O In: 3000 [I.V.:3000] Out: 1100 [Urine:300; Blood:800]  BLOOD ADMINISTERED: none  DRAINS: none   LOCAL MEDICATIONS USED:  none  SPECIMEN:  No Specimen  COUNTS:  YES  TOURNIQUET:  * No tourniquets in log *  DICTATION: .Other Dictation: Dictation Number [id number]  PLAN OF CARE: Admit to inpatient   PATIENT DISPOSITION:  PACU - hemodynamically stable

## 2015-08-08 NOTE — ED Notes (Signed)
Pt from [facility] independent living. Per PTAR pt c/o of left knee pain. PTAR states he may have twisted it while pivoting. Usually used a rolling walker but is not able to put any weight on left leg.

## 2015-08-08 NOTE — Anesthesia Preprocedure Evaluation (Addendum)
Anesthesia Evaluation  Patient identified by MRN, date of birth, ID band Patient awake    Reviewed: Allergy & Precautions, H&P , NPO status , Patient's Chart, lab work & pertinent test results  History of Anesthesia Complications Negative for: history of anesthetic complications  Airway Mallampati: I  TM Distance: >3 FB Neck ROM: full    Dental  (+) Poor Dentition, Missing   Pulmonary former smoker,    Pulmonary exam normal breath sounds clear to auscultation       Cardiovascular Normal cardiovascular exam+ dysrhythmias Atrial Fibrillation + Valvular Problems/Murmurs  Rhythm:regular Rate:Normal  Does have systolic low volume murmur, no echo in past, METS are actually > 4 as he does stationary bike for over 20 minutes every day  afib is controlled on amiodarone and he is currently in normal sinus rhythm, orthostatic hypotension is controlled with florinef  Denies chest pain, shortness of breath   Neuro/Psych negative neurological ROS     GI/Hepatic negative GI ROS, Neg liver ROS,   Endo/Other  negative endocrine ROS  Renal/GU CRFRenal disease     Musculoskeletal   Abdominal   Peds  Hematology  (+) anemia ,   Anesthesia Other Findings   Reproductive/Obstetrics negative OB ROS                           Anesthesia Physical Anesthesia Plan  ASA: III  Anesthesia Plan: General   Post-op Pain Management:    Induction: Intravenous  Airway Management Planned: Oral ETT  Additional Equipment:   Intra-op Plan:   Post-operative Plan: Extubation in OR  Informed Consent: I have reviewed the patients History and Physical, chart, labs and discussed the procedure including the risks, benefits and alternatives for the proposed anesthesia with the patient or authorized representative who has indicated his/her understanding and acceptance.   Dental Advisory Given  Plan Discussed with:  Anesthesiologist, CRNA and Surgeon  Anesthesia Plan Comments:         Anesthesia Quick Evaluation

## 2015-08-08 NOTE — Brief Op Note (Signed)
08/08/2015  10:54 PM  PATIENT:  Edwyna Ready  82 y.o. male  PRE-OPERATIVE DIAGNOSIS:  Left hip fracture  POST-OPERATIVE DIAGNOSIS:  Left hip fracture  PROCEDURE:  Procedure(s): ANTERIOR APPROACH HEMI HIP ARTHROPLASTY (Left)  SURGEON:  Surgeon(s) and Role:    * [name], MD - Primary  PHYSICIAN ASSISTANT: Johnell Landowski m. Camya Haydon pa-c   ANESTHESIA:   general  EBL:  Total I/O In: 1000 [I.V.:1000] Out: 1100 [Urine:300; Blood:800]  BLOOD ADMINISTERED:none  DRAINS: none   LOCAL MEDICATIONS USED:  NONE  SPECIMEN:  No Specimen  DISPOSITION OF SPECIMEN:  N/A  COUNTS:  YES  TOURNIQUET:  * No tourniquets in log *  DICTATION: .Dragon Dictation  PLAN OF CARE: Admit to inpatient   PATIENT DISPOSITION:  PACU - hemodynamically stable.

## 2015-08-08 NOTE — H&P (Signed)
History and Physical    Ronald Ochoa AXE:[id number] DOB: [birth year] DOA: 08/08/2015  Referring Provider: Harrell Gave lawyer, ED PA-C  PCP: [name], MD  Outpatient Specialists:  Cardiology: Dr. [name]  Patient coming from: [facility] facility  Chief Complaint: left knee pain.   HPI: Ronald Ochoa is a 82 y.o. male, widowed, lives at an independent living facility, ambulates with a walker and is independent of his activities of daily living, rides on a stationary bicycle daily, PMH of A. fib-not on anticoagulation, hypercholesterolemia, stage III chronic kidney disease, orthostatic hypotension on fludrocortisone, lung cancer with bilateral pulmonary metastasis, right adrenal met the stasis and lytic lesions to L2 (as per PET scan 11/19/14) which patient has opted not to treat, presented to [facility] ED with complaints of left knee pain. Patient and his son/HCPOA Ronald Ochoa at bedside provided history. Patient was in his usual state of health until this morning, went down to have his breakfast at about 8:15 AM and son visited him at approximately 10 AM and he had no complaints. Sometime later in the morning, patient started complaining of left knee pain and unable to weight-bear on that limb. No history of fall or direct trauma. Pain is rated at 5/10 in severity, worse with movement of left lower extremity and relieved by keeping the limbs still in bed. Pain is reported sharp.  ED Course: x-ray of hip confirms nondisplaced community impacted left mid femoral neck fracture. Hemoglobin 11.9, platelets 142, creatinine 1.44, chest x-ray consistent with known history of lung carcinoma without acute abnormality. Orthopedics evaluated patient in ED and initial plans were to transfer to [facility] for surgery but that has been changed and patient is planned for surgery at [facility] later tonight. Patient and son have consented for procedure.  Review of Systems:    All other systems reviewed and apart from HPI, are negative. Patient denies history of MI, CAD, CHF, COPD or asthma. He denies dyspnea, chest pain, palpitations, dizziness, lightheadedness, fever or chills. Patient reports easy bruising even to minimal trauma of limbs.   Past Medical History  Diagnosis Date  . Atrial fibrillation ([employer])   . Hypercholesterolemia   . Chronic renal failure, stage 3 (moderate)   . Multiple lung nodules on CT     Past Surgical History  Procedure Laterality Date  . Appendectomy  1943   Social history   reports that he quit smoking about 62 years ago. His smoking use included Cigarettes. He has a 7.5 pack-year smoking history. He does not have any smokeless tobacco history on file. He reports that he does not drink alcohol or use illicit drugs.  Remote history of tobacco and alcohol use. Spouse demised in 2005.   No Known Allergies  Family History  Problem Relation Age of Onset  . CAD Father   . Cancer Mother   . Colon cancer Sister   . Liver cancer Daughter   . Hepatitis C Daughter   . Melanoma Son      Prior to Admission medications   Medication Sig Start Date End Date Taking? Authorizing Provider  amiodarone (PACERONE) 200 MG tablet Take 1 tablet (200 mg total) by mouth daily. 12/02/14  Yes Jerline Pain, MD  aspirin EC 325 MG tablet Take 650 mg by mouth daily as needed for mild pain.    Yes Historical Provider, MD  DimenhyDRINATE (DRAMAMINE PO) Take 1 tablet by mouth daily as needed (dizzyness).   Yes Historical Provider, MD  fludrocortisone (FLORINEF)  0.1 MG tablet Take 0.1 mg by mouth daily.   Yes Historical Provider, MD  pravastatin (PRAVACHOL) 20 MG tablet Take 20 mg by mouth daily.   Yes Historical Provider, MD    Physical Exam: Filed Vitals:   08/08/15 1541 08/08/15 1545 08/08/15 1801 08/08/15 1840  BP:  115/104 176/70 181/69  Pulse:  59 63 63  Temp:  97.9 F (36.6 C) 97.8 F (36.6 C) 97.9 F (36.6 C)  TempSrc:  Oral Oral Oral   Resp:  16 20 19   SpO2: 96% 97% 97% 97%    Constitutional: pleasant elderly male, moderately built and thinly nourished, lying comfortably supine in the gurney in the ED.  Eyes: PERTLA, lids and conjunctivae normal ENMT: Mucous membranes are moist. Posterior pharynx clear of any exudate or lesions. Normal dentition.  Neck: normal, supple, no masses, no thyromegaly Respiratory: clear to auscultation bilaterally, no wheezing, no crackles. Normal respiratory effort. No accessory muscle use.  Cardiovascular: S1 & S2 heard, regular rate and rhythm, no murmurs / rubs / gallops. No extremity edema. 2+ pedal pulses. No carotid bruits.  Abdomen: No distension, no tenderness, no masses palpated. No hepatosplenomegaly. Bowel sounds normal.  Musculoskeletal: no clubbing / cyanosis. No joint deformity upper and lower extremities. Good ROM, no contractures. Normal muscle tone.  Skin: no rashes, lesions, ulcers. No induration. Multiple bruises over upper extremities, left >right.  Neurologic: CN 2-12 grossly intact. Sensation intact, DTR normal. Strength 5/5 in all 4 limbs except left lower extremity where power assessment is limited secondary to pain. Left lower extremity is shortened and externally rotated.   Psychiatric: Normal judgment and insight. Alert and oriented x 3. Normal mood.     Labs on Admission: I have personally reviewed following labs and imaging studies  CBC:  Recent Labs Lab 08/08/15 1713  WBC 8.5  NEUTROABS 6.6  HGB 11.9*  HCT 36.4*  MCV 93.3  PLT 098*   Basic Metabolic Panel:  Recent Labs Lab 08/08/15 1713  NA 145  K 3.5  CL 109  CO2 26  GLUCOSE 93  BUN 29*  CREATININE 1.44*  CALCIUM 8.6*   GFR: CrCl cannot be calculated (Unknown ideal weight.). Liver Function Tests: No results for input(s): AST, ALT, ALKPHOS, BILITOT, PROT, ALBUMIN in the last 168 hours. No results for input(s): LIPASE, AMYLASE in the last 168 hours. No results for input(s): AMMONIA in the  last 168 hours. Coagulation Profile: No results for input(s): INR, PROTIME in the last 168 hours. Cardiac Enzymes: No results for input(s): CKTOTAL, CKMB, CKMBINDEX, TROPONINI in the last 168 hours. BNP (last 3 results) No results for input(s): PROBNP in the last 8760 hours. HbA1C: No results for input(s): HGBA1C in the last 72 hours. CBG: No results for input(s): GLUCAP in the last 168 hours. Lipid Profile: No results for input(s): CHOL, HDL, LDLCALC, TRIG, CHOLHDL, LDLDIRECT in the last 72 hours. Thyroid Function Tests: No results for input(s): TSH, T4TOTAL, FREET4, T3FREE, THYROIDAB in the last 72 hours. Anemia Panel: No results for input(s): VITAMINB12, FOLATE, FERRITIN, TIBC, IRON, RETICCTPCT in the last 72 hours. Urine analysis: No results found for: COLORURINE, APPEARANCEUR, LABSPEC, [place], GLUCOSEU, HGBUR, BILIRUBINUR, KETONESUR, PROTEINUR, UROBILINOGEN, NITRITE, LEUKOCYTESUR    Radiological Exams on Admission: Dg Chest Port 1 View  08/08/2015  CLINICAL DATA:  Known left hip fracture, recent fall EXAM: PORTABLE CHEST 1 VIEW COMPARISON:  11/19/2014 FINDINGS: Cardiac shadow is within normal limits. Aortic calcifications are noted. Right hilar mass lesion is noted consistent with the patient's  known lung carcinoma. No new focal mass lesion is seen. No infiltrate is noted. Degenerative changes of thoracic spine are seen. IMPRESSION: Changes consistent with the known history of lung carcinoma. No acute abnormality is noted. Electronically Signed   By: Inez Catalina M.D.   On: 08/08/2015 17:30   Dg Hip Unilat With Pelvis 2-3 Views Left  08/08/2015  CLINICAL DATA:  Fall today with left hip pain EXAM: DG HIP (WITH OR WITHOUT PELVIS) 2-3V LEFT COMPARISON:  None. FINDINGS: Nondisplaced comminuted impacted left mid femoral neck fracture with mild apex lateral angulation. No additional fracture. No hip dislocation. Mild-to-moderate osteoarthritis in the weight-bearing portions of both hip  joints. Marked degenerative changes in the visualized lower lumbar spine. IMPRESSION: Nondisplaced comminuted impacted left mid femoral neck fracture. Electronically Signed   By: Ilona Sorrel M.D.   On: 08/08/2015 16:37    EKG: Independently reviewed. Sinus rhythm with? BBB morphology, normal axis and in no acute changes. QTC 510 ms.   Assessment/Plan Principal Problem:   Fracture of femoral neck, left (HCC) Active Problems:   Paroxysmal atrial fibrillation (HCC)   Orthostatic hypotension   Lung cancer (HCC)   Anemia   Thrombocytopenia (HCC)   CKD (chronic kidney disease) stage 3, GFR 30-59 ml/min   Femoral neck fracture (HCC)     Fracture of left femoral neck - Possibly from significant osteoporosis and may have twisted during ambulation. No reported fall or direct trauma. Other differential diagnosis is pathological fracture from known metastatic lung cancer.  - Orthopedics have consulted and plan surgical fixation tonight.  - Based on available data patient is at least a moderate risk for preoperative CV events related to advanced age, frail physical status but may proceed with indicated surgery with close perioperative monitoring.   Paroxysmal atrial fibrillation - Continue amiodarone. Patient is not on anticoagulation or antiplatelets, likely secondary to bleeding risk.  Orthostatic hypotension - Continue fludrocortisone. Hemodynamically stable at this time.  Anemia - Follow CBC postoperatively.  Thrombocytopenia - Follow CBC postoperatively.  Stage III chronic kidney disease - Creatinine at baseline.  Hyperlipidemia - Continue statins.  Stage IV lung cancer - PET scan 11/19/14: Central right mid lung mass is intensely hypermetabolic compatible with primary bronchogenic carcinoma, bilateral pulmonary metastasis, right adrenal gland metastasis stasis and lytic metastasis to the L2 vertebrae. - Patient opted no treatment.  ? Prolonged QTC -  repeat EKG in a.m.     DVT prophylaxis: SCD's.  Code Status: DO NOT RESUSCITATE. Confirmed with patient in the presence of his son.   Family Communication: discussed in detail with patient's son/healthcare power of attorney Mr. Ronald Ochoa at bedside.  Disposition Plan: SNF when medically stable  Consults called: orthopedics/Dr. [name]  Admission status: inpatient, medical bed.     [facility] MD [facility] Pager [phone]   If 7PM-7AM, please contact night-coverage [email or web address] Password [facility]  08/08/2015, 7:15 PM

## 2015-08-08 NOTE — ED Provider Notes (Signed)
CSN: [id number]     Arrival date & time 08/08/15  1531 History   First MD Initiated Contact with Patient 08/08/15 1543     Chief Complaint  Patient presents with  . Knee Pain   HPI The patient is a 81 year old white male with hx of HLD, A.fib. CKD stage 3, & orthostatic hypotension who presents today via EMS with left knee pain. He does not know when the pain started, denies trauma, fall or injury to the leg. Rates pain 5/10, and aching. He woke up this morning and ate breakfast with no complaints then started to endorse L knee pain, unclear of actual inciting event or timing. Denies referred pain, states he is unable to weight bare on leg. Reported seen walking with some assistance by nurse and EMS. He lives in an independent living, currently performs all ADLs by himself. Denies head trauma, blurry vision, nausea, or vomiting.    Past Medical History  Diagnosis Date  . Atrial fibrillation ([place])   . Hypercholesterolemia   . Chronic renal failure, stage 3 (moderate)   . Multiple lung nodules on CT    Past Surgical History  Procedure Laterality Date  . Appendectomy  1943   Family History  Problem Relation Age of Onset  . CAD Father   . Cancer Mother   . Colon cancer Sister   . Liver cancer Daughter   . Hepatitis C Daughter   . Melanoma Son    Social History  Substance Use Topics  . Smoking status: Former Smoker -- 0.50 packs/day for 15 years    Types: Cigarettes    Quit date: 04/18/1953  . Smokeless tobacco: None  . Alcohol Use: No    Review of Systems All other systems negative except as documented in the HPI. All pertinent positives and negatives as reviewed in the HPI.  Allergies  Review of patient's allergies indicates no known allergies.  Home Medications   Prior to Admission medications   Medication Sig Start Date End Date Taking? Authorizing Provider  amiodarone (PACERONE) 200 MG tablet Take 1 tablet (200 mg total) by mouth daily. 12/02/14   Jerline Pain, MD   fludrocortisone (FLORINEF) 0.1 MG tablet Take 0.1 mg by mouth daily.    Historical Provider, MD  pravastatin (PRAVACHOL) 20 MG tablet Take 20 mg by mouth daily.    Historical Provider, MD   BP 115/104 mmHg  Pulse 59  Temp(Src) 97.9 F (36.6 C) (Oral)  Resp 16  SpO2 97% Physical Exam  Constitutional: He appears well-developed and well-nourished.  HENT:  Head: Normocephalic and atraumatic.  Mouth/Throat: Oropharynx is clear and moist.  Eyes: Pupils are equal, round, and reactive to light.  Neck: Normal range of motion. Neck supple.  Cardiovascular: Normal rate and normal heart sounds.  Exam reveals no gallop and no friction rub.   No murmur heard. Irregular rhythm. Distal pulses DP & PT intact bilaterally  Pulmonary/Chest: Effort normal and breath sounds normal. No respiratory distress. He has no wheezes. He has no rales.  Abdominal: Soft. Bowel sounds are normal. He exhibits no distension. There is no tenderness.  Musculoskeletal: He exhibits no edema.  Left leg is shortened and internally rotated. Pain with internal rotation of hip joint. Knee and hip nontender to palpation.   Lymphadenopathy:    He has no cervical adenopathy.  Neurological: He is alert. He exhibits normal muscle tone. Coordination normal.  Skin: Skin is warm and dry. No rash noted. No erythema.  No erythematous  warm lesions around hip or popliteal fossa.  Multiple ecchymoses in different stages of healing on arms bilaterally  Psychiatric: He has a normal mood and affect. His behavior is normal.  Nursing note and vitals reviewed.   ED Course  Procedures (including critical care time) Labs Review Labs Reviewed - No data to display  Imaging Review Dg Pelvis Portable  08/08/2015  CLINICAL DATA:  Postop anterior approach hemiarthroplasty EXAM: PORTABLE PELVIS 1-2 VIEWS COMPARISON:  Left hip radiographs dated 08/08/2015 at 1620 hours FINDINGS: Left hip hemiarthroplasty in satisfactory position. Associated  subcutaneous gas. No fracture or dislocation is seen. Visualized bony pelvis appears intact. Mild degenerative changes of the right hip. IMPRESSION: Left hip hemiarthroplasty in satisfactory position. Electronically Signed   By: Julian Hy M.D.   On: 08/08/2015 23:54   Dg Chest Port 1 View  08/08/2015  CLINICAL DATA:  Known left hip fracture, recent fall EXAM: PORTABLE CHEST 1 VIEW COMPARISON:  11/19/2014 FINDINGS: Cardiac shadow is within normal limits. Aortic calcifications are noted. Right hilar mass lesion is noted consistent with the patient's known lung carcinoma. No new focal mass lesion is seen. No infiltrate is noted. Degenerative changes of thoracic spine are seen. IMPRESSION: Changes consistent with the known history of lung carcinoma. No acute abnormality is noted. Electronically Signed   By: Inez Catalina M.D.   On: 08/08/2015 17:30   Dg C-arm 1-60 Min-no Report  08/08/2015  CLINICAL DATA: perioperative use of fluoro for hip fracture repair C-ARM 1-60 MINUTES Fluoroscopy was utilized by the requesting physician.  No radiographic interpretation.   Dg Hip Operative Unilat W Or W/o Pelvis Left  08/08/2015  CLINICAL DATA:  New left hip arthroplasty EXAM: OPERATIVE LEFT HIP (WITH PELVIS IF PERFORMED)  VIEWS TECHNIQUE: Fluoroscopic spot image(s) were submitted for interpretation post-operatively. FLUOROSCOPY TIME:  21.8 seconds COMPARISON:  Left hip radiographs dated 08/08/2015 FINDINGS: Intraoperative fluoroscopic images during left hip hemiarthroplasty. Surgical hardware in anatomic position. IMPRESSION: Intraoperative fluoroscopic images during left hip hemiarthroplasty. Electronically Signed   By: Julian Hy M.D.   On: 08/08/2015 23:22   Dg Hip Unilat With Pelvis 2-3 Views Left  08/08/2015  CLINICAL DATA:  Fall today with left hip pain EXAM: DG HIP (WITH OR WITHOUT PELVIS) 2-3V LEFT COMPARISON:  None. FINDINGS: Nondisplaced comminuted impacted left mid femoral neck fracture with  mild apex lateral angulation. No additional fracture. No hip dislocation. Mild-to-moderate osteoarthritis in the weight-bearing portions of both hip joints. Marked degenerative changes in the visualized lower lumbar spine. IMPRESSION: Nondisplaced comminuted impacted left mid femoral neck fracture. Electronically Signed   By: Ilona Sorrel M.D.   On: 08/08/2015 16:37   I have personally reviewed and evaluated these images and lab results as part of my medical decision-making.   MDM   The patient is a 81 year old male, presents with knee pain. PE reveals internally rotated and shortened left leg, likely referred hip pain. Xray of L hip ordered to r/o fractures.  Imaging shows non-displaced comminuted fracture of L femoral neck.    Spoke with Dr. Marlou Sa orthopedic surgery who will evaluate the patient for surgical intervention on the left femoral neck fracture.  Also spoke with the [facility] who will admit the patient  Dalia Heading, PA-C 08/09/15 0139  Carmin Muskrat, MD 08/09/15 2224

## 2015-08-08 NOTE — ED Notes (Signed)
INFORMED CONSENT FOR SURGERY COMPLETE.

## 2015-08-08 NOTE — Transfer of Care (Signed)
Immediate Anesthesia Transfer of Care Note  Patient: Ronald Ochoa  Procedure(s) Performed: Procedure(s): ANTERIOR APPROACH HEMI HIP ARTHROPLASTY (Left)  Patient Location: PACU  Anesthesia Type:General  Level of Consciousness: sedated  Airway & Oxygen Therapy: Patient Spontanous Breathing and Patient connected to face mask oxygen  Post-op Assessment: Report given to RN and Post -op Vital signs reviewed and stable  Post vital signs: Reviewed and stable  Last Vitals:  Filed Vitals:   08/08/15 1801 08/08/15 1840  BP: 176/70 181/69  Pulse: 63 63  Temp: 36.6 C 36.6 C  Resp: 20 19    Complications: No apparent anesthesia complications

## 2015-08-09 DIAGNOSIS — Z9660 Presence of unspecified orthopedic joint implant: Secondary | ICD-10-CM

## 2015-08-09 DIAGNOSIS — S72002D Fracture of unspecified part of neck of left femur, subsequent encounter for closed fracture with routine healing: Secondary | ICD-10-CM

## 2015-08-09 DIAGNOSIS — D62 Acute posthemorrhagic anemia: Secondary | ICD-10-CM

## 2015-08-09 LAB — MRSA PCR SCREENING: MRSA by PCR: NEGATIVE

## 2015-08-09 LAB — BASIC METABOLIC PANEL
Anion gap: 8 (ref 5–15)
BUN: 29 mg/dL — AB (ref 6–20)
CHLORIDE: 107 mmol/L (ref 101–111)
CO2: 27 mmol/L (ref 22–32)
CREATININE: 1.28 mg/dL — AB (ref 0.61–1.24)
Calcium: 8.1 mg/dL — ABNORMAL LOW (ref 8.9–10.3)
GFR calc non Af Amer: 47 mL/min — ABNORMAL LOW (ref >60)
GFR, EST AFRICAN AMERICAN: 54 mL/min — AB (ref >60)
GLUCOSE: 229 mg/dL — AB (ref 65–99)
Potassium: 3.9 mmol/L (ref 3.5–5.1)
Sodium: 142 mmol/L (ref 135–145)

## 2015-08-09 LAB — CBC
HEMATOCRIT: 33.7 % — AB (ref 39.0–52.0)
HEMOGLOBIN: 11.3 g/dL — AB (ref 13.0–17.0)
MCH: 29 pg (ref 26.0–34.0)
MCHC: 33.5 g/dL (ref 30.0–36.0)
MCV: 86.6 fL (ref 78.0–100.0)
Platelets: 123 10*3/uL — ABNORMAL LOW (ref 150–400)
RBC: 3.89 MIL/uL — ABNORMAL LOW (ref 4.22–5.81)
RDW: 16.5 % — AB (ref 11.5–15.5)
WBC: 9.8 10*3/uL (ref 4.0–10.5)

## 2015-08-09 LAB — GLUCOSE, CAPILLARY: Glucose-Capillary: 148 mg/dL — ABNORMAL HIGH (ref 65–99)

## 2015-08-09 MED ORDER — PRAVASTATIN SODIUM 20 MG PO TABS
20.0000 mg | ORAL_TABLET | Freq: Every day | ORAL | Status: DC
  Administered 2015-08-09 – 2015-08-10 (×2): 20 mg via ORAL
  Filled 2015-08-09 (×2): qty 1

## 2015-08-09 MED ORDER — TRAMADOL HCL 50 MG PO TABS
50.0000 mg | ORAL_TABLET | Freq: Four times a day (QID) | ORAL | Status: DC | PRN
  Administered 2015-08-09 – 2015-08-10 (×4): 50 mg via ORAL
  Filled 2015-08-09 (×4): qty 1

## 2015-08-09 MED ORDER — HYDROCODONE-ACETAMINOPHEN 5-325 MG PO TABS
1.0000 | ORAL_TABLET | Freq: Four times a day (QID) | ORAL | Status: DC | PRN
  Administered 2015-08-09: 1 via ORAL
  Filled 2015-08-09: qty 1

## 2015-08-09 MED ORDER — MENTHOL 3 MG MT LOZG: 1.0000 | LOZENGE | OROMUCOSAL | Status: DC | PRN

## 2015-08-09 MED ORDER — ONDANSETRON HCL 4 MG/2ML IJ SOLN: 4.0000 mg | Freq: Four times a day (QID) | INTRAMUSCULAR | Status: DC | PRN

## 2015-08-09 MED ORDER — CEFAZOLIN SODIUM 1-5 GM-% IV SOLN
1.0000 g | Freq: Three times a day (TID) | INTRAVENOUS | Status: AC
  Administered 2015-08-09 (×3): 1 g via INTRAVENOUS
  Filled 2015-08-09 (×3): qty 50

## 2015-08-09 MED ORDER — ONDANSETRON HCL 4 MG PO TABS: 4.0000 mg | ORAL_TABLET | Freq: Four times a day (QID) | ORAL | Status: DC | PRN

## 2015-08-09 MED ORDER — SODIUM CHLORIDE 0.9 % IV SOLN
INTRAVENOUS | Status: DC
  Administered 2015-08-09: 03:00:00 via INTRAVENOUS

## 2015-08-09 MED ORDER — ACETAMINOPHEN 650 MG RE SUPP: 650.0000 mg | Freq: Four times a day (QID) | RECTAL | Status: DC | PRN

## 2015-08-09 MED ORDER — FLUDROCORTISONE ACETATE 0.1 MG PO TABS
0.1000 mg | ORAL_TABLET | Freq: Every day | ORAL | Status: DC
  Administered 2015-08-09 – 2015-08-10 (×2): 0.1 mg via ORAL
  Filled 2015-08-09 (×2): qty 1

## 2015-08-09 MED ORDER — DOCUSATE SODIUM 100 MG PO CAPS
100.0000 mg | ORAL_CAPSULE | Freq: Two times a day (BID) | ORAL | Status: DC
  Administered 2015-08-09 – 2015-08-10 (×3): 100 mg via ORAL

## 2015-08-09 MED ORDER — ASPIRIN EC 325 MG PO TBEC
325.0000 mg | DELAYED_RELEASE_TABLET | Freq: Every day | ORAL | Status: DC
  Administered 2015-08-09 – 2015-08-10 (×2): 325 mg via ORAL
  Filled 2015-08-09 (×3): qty 1

## 2015-08-09 MED ORDER — ACETAMINOPHEN 325 MG PO TABS: 650.0000 mg | ORAL_TABLET | Freq: Four times a day (QID) | ORAL | Status: DC | PRN

## 2015-08-09 MED ORDER — POLYETHYLENE GLYCOL 3350 17 G PO PACK: 17.0000 g | PACK | Freq: Every day | ORAL | Status: DC | PRN

## 2015-08-09 MED ORDER — METOCLOPRAMIDE HCL 5 MG/ML IJ SOLN: 5.0000 mg | Freq: Three times a day (TID) | INTRAMUSCULAR | Status: DC | PRN

## 2015-08-09 MED ORDER — MORPHINE SULFATE (PF) 2 MG/ML IV SOLN
0.5000 mg | INTRAVENOUS | Status: DC | PRN
Start: 2015-08-09 — End: 2015-08-10

## 2015-08-09 MED ORDER — PHENOL 1.4 % MT LIQD
1.0000 | OROMUCOSAL | Status: DC | PRN
  Filled 2015-08-09: qty 177

## 2015-08-09 MED ORDER — METOCLOPRAMIDE HCL 10 MG PO TABS: 5.0000 mg | ORAL_TABLET | Freq: Three times a day (TID) | ORAL | Status: DC | PRN

## 2015-08-09 MED ORDER — AMIODARONE HCL 200 MG PO TABS
200.0000 mg | ORAL_TABLET | Freq: Every day | ORAL | Status: DC
  Administered 2015-08-09 – 2015-08-10 (×2): 200 mg via ORAL
  Filled 2015-08-09 (×2): qty 1

## 2015-08-09 NOTE — Progress Notes (Addendum)
PROGRESS NOTE  Ronald Ochoa  SJG:[id number] DOB: 11/28/1920  DOA: 08/08/2015 PCP: [name], MD  Outpatient Specialists:  Cardiology: Dr. [name]  Brief Narrative:  82 y.o. male, widowed, lives at an independent living facility, ambulates with a walker and is independent of his activities of daily living, rides on a stationary bicycle daily, PMH of A. fib-not on anticoagulation, hypercholesterolemia, stage III chronic kidney disease, orthostatic hypotension on fludrocortisone, lung cancer with bilateral pulmonary metastasis, right adrenal met the stasis and lytic lesions to L2 (as per PET scan 11/19/14) which patient has opted not to treat, presented to [facility] ED with complaints of left knee pain. Diagnosed with left femoral neck fracture. Orthopedics was consulted and patient underwent hemi-arthroplasty on night of admission.   Assessment & Plan:   Principal Problem:   Fracture of femoral neck, left (HCC) Active Problems:   Paroxysmal atrial fibrillation (HCC)   Orthostatic hypotension   Lung cancer (HCC)   Anemia   Thrombocytopenia (HCC)   CKD (chronic kidney disease) stage 3, GFR 30-59 ml/min   Femoral neck fracture (HCC)   S/P hip hemiarthroplasty   Fracture of left femoral neck - Possibly from significant osteoporosis and may have twisted during ambulation. No reported fall or direct trauma. Other differential diagnosis is pathological fracture from known metastatic lung cancer.  - Orthopedics was consulted and patient underwent hemi-arthroplasty on night of admission. - As per orthopedics, weightbearing as tolerated and DC to SNF hopefully in next 1-2 days  Paroxysmal atrial fibrillation - Continue amiodarone. Patient is not on anticoagulation or antiplatelets, likely secondary to bleeding risk.  Orthostatic hypotension - Continue fludrocortisone. Hemodynamically stable at this time.  Acute Blood Loss Anemia complicating chronic Anemia -  Operative EBL: 600 ml. Transfused 2 units PRBC's intraoperatively. Hb stable. Follow CBC's in AM.  Thrombocytopenia - relatively stable. Follow CBC in am.  Stage III chronic kidney disease - Creatinine at baseline.  Hyperlipidemia - Continue statins.  Stage IV lung cancer - PET scan 11/19/14: Central right mid lung mass is intensely hypermetabolic compatible with primary bronchogenic carcinoma, bilateral pulmonary metastasis, right adrenal gland metastasis stasis and lytic metastasis to the L2 vertebrae. - Patient opted no treatment.  ? Prolonged QTC - repeat EKG 4/23: QTC 442 ms. Resolved.  Confusion - likely d/t acute illness, surgery, pain and hospitalization complicating underlying mild dementia. Monitor.   DVT prophylaxis: SCD's.  Code Status: DO NOT RESUSCITATE. Confirmed with patient in the presence of his son, on admission.  Family Communication: None at bedside.  Disposition Plan: SNF when medically hopefully in 1-2 days.   Consultants:   Orthopedics.  Procedures:   Left hip hemi-arthroplasty  Foley  Antimicrobials:   None    Subjective: Pleasantly confused. No significant pain reported. As per RN, no acute issues.  Objective:  Filed Vitals:   08/09/15 0234 08/09/15 0254 08/09/15 0444 08/09/15 1141  BP: 111/47 113/47 134/48 124/40  Pulse: 57 55 55 62  Temp: 98 F (36.7 C) 98 F (36.7 C) 97.6 F (36.4 C) 97.8 F (36.6 C)  TempSrc: Axillary Axillary Oral Oral  Resp: 12 16 14 16   SpO2: 97% 98% 99% 96%    Intake/Output Summary (Last 24 hours) at 08/09/15 1409 Last data filed at 08/09/15 1141  Gross per 24 hour  Intake   4050 ml  Output   1325 ml  Net   2725 ml   There were no vitals filed for this visit.  Examination:  General exam: Pleasantly elderly  male lying comfortably supine in bed. Appears calm and comfortable  Respiratory system: Clear to auscultation. Respiratory effort normal. Cardiovascular system: S1 & S2 heard, RRR. No JVD,  murmurs, rubs, gallops or clicks. No pedal edema. Gastrointestinal system: Abdomen is nondistended, soft and nontender. No organomegaly or masses felt. Normal bowel sounds heard. Central nervous system: Alert and oriented only to self. No focal neurological deficits. Extremities: Symmetric 5 x 5 power except LLE where limited movements d/t to post op pain. Left hip op site dressing clean and dry.. Skin: No rashes, lesions or ulcers   Data Reviewed: I have personally reviewed following labs and imaging studies  CBC:  Recent Labs Lab 08/08/15 1713 08/09/15 0651  WBC 8.5 9.8  NEUTROABS 6.6  --   HGB 11.9* 11.3*  HCT 36.4* 33.7*  MCV 93.3 86.6  PLT 142* 315*   Basic Metabolic Panel:  Recent Labs Lab 08/08/15 1713 08/09/15 0651  NA 145 142  K 3.5 3.9  CL 109 107  CO2 26 27  GLUCOSE 93 229*  BUN 29* 29*  CREATININE 1.44* 1.28*  CALCIUM 8.6* 8.1*   GFR: CrCl cannot be calculated (Unknown ideal weight.). Liver Function Tests: No results for input(s): AST, ALT, ALKPHOS, BILITOT, PROT, ALBUMIN in the last 168 hours. No results for input(s): LIPASE, AMYLASE in the last 168 hours. No results for input(s): AMMONIA in the last 168 hours. Coagulation Profile: No results for input(s): INR, PROTIME in the last 168 hours. Cardiac Enzymes: No results for input(s): CKTOTAL, CKMB, CKMBINDEX, TROPONINI in the last 168 hours. BNP (last 3 results) No results for input(s): PROBNP in the last 8760 hours. HbA1C: No results for input(s): HGBA1C in the last 72 hours. CBG: No results for input(s): GLUCAP in the last 168 hours. Lipid Profile: No results for input(s): CHOL, HDL, LDLCALC, TRIG, CHOLHDL, LDLDIRECT in the last 72 hours. Thyroid Function Tests: No results for input(s): TSH, T4TOTAL, FREET4, T3FREE, THYROIDAB in the last 72 hours. Anemia Panel: No results for input(s): VITAMINB12, FOLATE, FERRITIN, TIBC, IRON, RETICCTPCT in the last 72 hours. Urine analysis: No results found  for: COLORURINE, APPEARANCEUR, LABSPEC, PHURINE, GLUCOSEU, HGBUR, BILIRUBINUR, KETONESUR, PROTEINUR, UROBILINOGEN, NITRITE, LEUKOCYTESUR  Recent Results (from the past 240 hour(s))  MRSA PCR Screening     Status: None   Collection Time: 08/09/15  6:30 AM  Result Value Ref Range Status   MRSA by PCR NEGATIVE NEGATIVE Final    Comment:        The GeneXpert MRSA Assay (FDA approved for NASAL specimens only), is one component of a comprehensive MRSA colonization surveillance program. It is not intended to diagnose MRSA infection nor to guide or monitor treatment for MRSA infections.          Radiology Studies: Dg Pelvis Portable  08/08/2015  CLINICAL DATA:  Postop anterior approach hemiarthroplasty EXAM: PORTABLE PELVIS 1-2 VIEWS COMPARISON:  Left hip radiographs dated 08/08/2015 at 1620 hours FINDINGS: Left hip hemiarthroplasty in satisfactory position. Associated subcutaneous gas. No fracture or dislocation is seen. Visualized bony pelvis appears intact. Mild degenerative changes of the right hip. IMPRESSION: Left hip hemiarthroplasty in satisfactory position. Electronically Signed   By: Julian Hy M.D.   On: 08/08/2015 23:54   Dg Chest Port 1 View  08/08/2015  CLINICAL DATA:  Known left hip fracture, recent fall EXAM: PORTABLE CHEST 1 VIEW COMPARISON:  11/19/2014 FINDINGS: Cardiac shadow is within normal limits. Aortic calcifications are noted. Right hilar mass lesion is noted consistent with the patient's known lung  carcinoma. No new focal mass lesion is seen. No infiltrate is noted. Degenerative changes of thoracic spine are seen. IMPRESSION: Changes consistent with the known history of lung carcinoma. No acute abnormality is noted. Electronically Signed   By: Inez Catalina M.D.   On: 08/08/2015 17:30   Dg C-arm 1-60 Min-no Report  08/08/2015  CLINICAL DATA: perioperative use of fluoro for hip fracture repair C-ARM 1-60 MINUTES Fluoroscopy was utilized by the requesting  physician.  No radiographic interpretation.   Dg Hip Operative Unilat W Or W/o Pelvis Left  08/08/2015  CLINICAL DATA:  New left hip arthroplasty EXAM: OPERATIVE LEFT HIP (WITH PELVIS IF PERFORMED)  VIEWS TECHNIQUE: Fluoroscopic spot image(s) were submitted for interpretation post-operatively. FLUOROSCOPY TIME:  21.8 seconds COMPARISON:  Left hip radiographs dated 08/08/2015 FINDINGS: Intraoperative fluoroscopic images during left hip hemiarthroplasty. Surgical hardware in anatomic position. IMPRESSION: Intraoperative fluoroscopic images during left hip hemiarthroplasty. Electronically Signed   By: Julian Hy M.D.   On: 08/08/2015 23:22   Dg Hip Unilat With Pelvis 2-3 Views Left  08/08/2015  CLINICAL DATA:  Fall today with left hip pain EXAM: DG HIP (WITH OR WITHOUT PELVIS) 2-3V LEFT COMPARISON:  None. FINDINGS: Nondisplaced comminuted impacted left mid femoral neck fracture with mild apex lateral angulation. No additional fracture. No hip dislocation. Mild-to-moderate osteoarthritis in the weight-bearing portions of both hip joints. Marked degenerative changes in the visualized lower lumbar spine. IMPRESSION: Nondisplaced comminuted impacted left mid femoral neck fracture. Electronically Signed   By: Ilona Sorrel M.D.   On: 08/08/2015 16:37        Scheduled Meds: . amiodarone  200 mg Oral Daily  . aspirin EC  325 mg Oral Q breakfast  .  ceFAZolin (ANCEF) IV  1 g Intravenous Q8H  . docusate sodium  100 mg Oral BID  . fludrocortisone  0.1 mg Oral Daily  . pravastatin  20 mg Oral Daily   Continuous Infusions: . sodium chloride 75 mL/hr at 08/09/15 0247     LOS: 1 day    Time spent: 30 minutes.    [facility], MD [facility] Pager [phone]  If 7PM-7AM, please contact night-coverage [email or web address] Password [email or web address] 08/09/2015, 2:09 PM

## 2015-08-09 NOTE — Progress Notes (Signed)
Pt admitted from PACU to 1601. Old drainage on left hip incision dressing marked. Ice packs to incision. Order for 2 units PRBC to be transfused, first unit started upon admission. Pt cognitively unable to participate in admission history, questions deferred until son returns in the morning.VS stable, pt resting comfortable. Continue to monitor. Hortencia Conradi RN

## 2015-08-09 NOTE — Op Note (Signed)
NAMEDEMETRICK, Ronald Ochoa NO.:  [id number]  MEDICAL RECORD NO.:  [id number]  LOCATION:  [facility]                         FACILITY:  [facility]  PHYSICIAN:  Anderson Malta, M.D.    DATE OF BIRTH:  12-21-1920  DATE OF PROCEDURE: DATE OF DISCHARGE:                              OPERATIVE REPORT   PREOPERATIVE DIAGNOSIS:  Left hip fracture.  POSTOPERATIVE DIAGNOSIS:  Left hip fracture.  PROCEDURE:  Left hip hemiarthroplasty.  SURGEON:  Anderson Malta, M.D.  ASSISTANT:  Benjiman Core, PA.  INDICATIONS:  Ronald Ochoa is an active patient with left hip fracture, presents for operative management after explanation of risks and benefits.  PROCEDURE DETAILS:  The patient was brought to the operating room where general anesthetic was induced.  Proper antibiotics were administered. A time-out was called.  The patient was placed on a Hana bed.  Little bit of preop traction and 60 degrees of external rotation was performed and the proximal level of the femoral neck cut was identified.  Left leg was then prescrubbed with alcohol and Betadine and allowed the air to dry, prepped with DuraPrep solution and draped in a sterile manner. Wall drape was utilized.  A 10 cm incision was made 2 fingerbreadths posterior and distal to the anterosuperior iliac crest angled towards the posterior aspect the lateral femoral condyle.  Skin and subcutaneous tissues were sharply divided.  The tensor fascia lata was identified. Once this plane was developed, the self-retracting clear-ring retractor was placed.  The fascia was then opened.  A blunt dissection was then used to develop a plane between the tensor fascia lata and the rectus. Superiorly, Cobra retractor was placed on the capsule and retraction was performed.  At this time, plane was developed distally.  Crossing circumflex vessels were coagulated.  At this time, Cobb elevator was used for remove pericapsular fat.  Cobra was then placed around  the inferior neck.  The capsulotomy was then performed, and the retractors placed within the capsule.  Capsule was tagged.  Capsule was retracted. The head was removed after making a maxim cut on the proximal aspect of the neck.  It was cleared off the soft tissue.  At this time, the decision was made to proceed with hemiarthroplasty. The leg was externally rotated and capsule was released down to the lesser.  At this time, the transverse acetabular ligament and some of the inferior capsule was released in order to facilitate lateral mobility and superior mobility of the proximal femur.  Following this, the hook retractor was placed.  The leg was then taken into external rotation and adduction and extension.  Hook retractor was then engaged, and the femur was lifted.  The trochanteric retractor was placed between the capsule and the muscle, and conjoint tendon was released.  This gave good exposure of the proximal femur.  Using a rongeur and a box cutter, the proximal femoral canal was opened.  At this time, Broaching was performed up to a size 14.  A 14 trial was placed and a +5 51 bipolar was placed.  This gave a good stability and internal-external rotation along with 40 degrees of extension and external rotation.  These true components  were placed.  The anterior capsular release had to be excised in order to facilitate full seating of the bipolar.  Same stability parameters were maintained with +5 head.  Leg lengths were equal under fluoroscopic evaluation.  The stem remained in the femur.  At this time, thorough irrigation was performed.  The capsule was closed where possible.  The fascia lata was then closed using #1 Vicryl suture followed by interrupted inverted 2-0 Vicryl suture, and staples. Mepilex dressing was placed.  The patient tolerated the procedure well without immediate complications, transferred to the recovery room in stable condition.     Anderson Malta,  M.D.     GSD/MEDQ  D:  08/08/2015  T:  08/09/2015  Job:  [id number]

## 2015-08-09 NOTE — Progress Notes (Signed)
Subjective: Patient stable pain controlled   Objective: Vital signs in last 24 hours: Temp:  [97 F (36.1 C)-98 F (36.7 C)] 97.6 F (36.4 C) (04/23 0444) Pulse Rate:  [55-63] 55 (04/23 0444) Resp:  [11-21] 14 (04/23 0444) BP: (102-181)/(39-104) 134/48 mmHg (04/23 0444) SpO2:  [96 %-100 %] 99 % (04/23 0444)  Intake/Output from previous day: 04/22 0701 - 04/23 0700 In: 3690 [I.V.:3000; Blood:690] Out: 1150 [Urine:350; Blood:800] Intake/Output this shift: Total I/O In: 0  Out: 175 [Urine:175]  Exam:  Sensation intact distally Intact pulses distally Dorsiflexion/Plantar flexion intact  Labs:  Recent Labs  08/08/15 1713 08/09/15 0651  HGB 11.9* 11.3*    Recent Labs  08/08/15 1713 08/09/15 0651  WBC 8.5 9.8  RBC 3.90* 3.89*  HCT 36.4* 33.7*  PLT 142* 123*    Recent Labs  08/08/15 1713 08/09/15 0651  NA 145 142  K 3.5 3.9  CL 109 107  CO2 26 27  BUN 29* 29*  CREATININE 1.44* 1.28*  GLUCOSE 93 229*  CALCIUM 8.6* 8.1*   No results for input(s): LABPT, INR in the last 72 hours.  Assessment/Plan: Patient during recently well status post hemiarthroplasty plan is for mobilization today weightbearing as tolerated possible discharge to skilled nursing facility Monday versus Tuesday   Yessika Otte SCOTT 08/09/2015, 10:49 AM

## 2015-08-09 NOTE — Evaluation (Signed)
Physical Therapy Evaluation Patient Details Name: Jeronimo Hellberg MRN: [id number] DOB: 1921-02-27 Today's Date: 08/09/2015   History of Present Illness  82 y.o. male, widowed, lives ALF, ambulates with a walker and is independent of his activities of daily living, rides on a stationary bicycle daily,  presented to [facility] ED with complaints of left knee pain; xray revealed L hip fx--underwent DA L hip hemiarthroplasty;            PMH of A. fib-not on anticoagulation, hypercholesterolemia, stage III chronic kidney disease, orthostatic hypotension on fludrocortisone, lung cancer with bilateral pulmonary metastasis, right adrenal met the stasis and lytic lesions to L2 (as per PET scan 11/19/14) which patient has opted not to treat  Clinical Impression  Pt admitted with above diagnosis. Pt currently with functional limitations due to the deficits listed below (see PT Problem List).  Pt will benefit from skilled PT to increase their independence and safety with mobility to allow discharge to the venue listed below.   Recommend SNF, pt +2 mod for mobility/transfers today, very fearful of falls   Follow Up Recommendations SNF    Equipment Recommendations  None recommended by PT    Recommendations for Other Services       Precautions / Restrictions Precautions Precautions: Fall Restrictions LLE Weight Bearing: Weight bearing as tolerated      Mobility  Bed Mobility Overal bed mobility: Needs Assistance;+2 for physical assistance Bed Mobility: Supine to Sit     Supine to sit: Max assist;+2 for physical assistance;+2 for safety/equipment     General bed mobility comments: assist with trunk and LEs, bed pad used to scoot pt laterally and to EOB  Transfers Overall transfer level: Needs assistance Equipment used: Rolling walker (2 wheeled) Transfers: Sit to/from [facility] Sit to Stand: Min assist;+2 physical assistance Stand pivot transfers: Mod assist;+2  physical assistance;+2 safety/equipment       General transfer comment: pt is quite anxious and fearful of falling; multi-modal cues for sequencing and safety; +2 throughout for balance, safety, maneuvering walker  Ambulation/Gait   Ambulation Distance (Feet):  (pivotal steps to  chair only)            Stairs            Wheelchair Mobility    Modified Rankin (Stroke Patients Only)       Balance Overall balance assessment: Needs assistance;History of Falls   Sitting balance-Leahy Scale: Poor   Postural control: Posterior lean   Standing balance-Leahy Scale: Zero                               Pertinent Vitals/Pain Pain Assessment: Faces Faces Pain Scale: Hurts even more Pain Location: L hip Pain Descriptors / Indicators: Grimacing;Guarding Pain Intervention(s): Monitored during session;Limited activity within patient's tolerance;Premedicated before session;Ice applied    Home Living Family/patient expects to be discharged to:: Skilled nursing facility                      Prior Function Level of Independence: Independent with assistive device(s);Independent         Comments: amb with walker     Hand Dominance        Extremity/Trunk Assessment   Upper Extremity Assessment: Generalized weakness           Lower Extremity Assessment: Generalized weakness;LLE deficits/detail   LLE Deficits / Details: AAROM grossly WFL, within limits of pain  Communication   Communication: No difficulties  Cognition Arousal/Alertness: Awake/alert Behavior During Therapy: Anxious Overall Cognitive Status: Within Functional Limits for tasks assessed                      General Comments      Exercises        Assessment/Plan    PT Assessment Patient needs continued PT services  PT Diagnosis Difficulty walking;Generalized weakness   PT Problem List Decreased strength;Decreased activity tolerance;Decreased  balance;Decreased mobility;Decreased safety awareness;Pain  PT Treatment Interventions DME instruction;Gait training;Functional mobility training;Therapeutic activities;Therapeutic exercise;Patient/family education   PT Goals (Current goals can be found in the Care Plan section) Acute Rehab PT Goals Patient Stated Goal: none stated PT Goal Formulation: With patient Time For Goal Achievement: 08/14/15 Potential to Achieve Goals: Good    Frequency Min 3X/week   Barriers to discharge        Co-evaluation               End of Session Equipment Utilized During Treatment: Gait belt Activity Tolerance: Patient tolerated treatment well Patient left: with call bell/phone within reach;in chair;with chair alarm set;with family/visitor present Nurse Communication: Mobility status         Time: 3958-4417 PT Time Calculation (min) (ACUTE ONLY): 17 min   Charges:   PT Evaluation $PT Eval Moderate Complexity: 1 Procedure     PT G Codes:        Abhi Moccia 2015-08-21, 11:36 AM

## 2015-08-09 NOTE — Progress Notes (Signed)
Utilization review completed.  

## 2015-08-09 NOTE — Clinical Social Work Placement (Signed)
   CLINICAL SOCIAL WORK PLACEMENT  NOTE  Date:  08/09/2015  Patient Details  Name: Ronald Ochoa MRN: [id number] Date of Birth: [birth year]  Clinical Social Work is seeking post-discharge placement for this patient at the [place] level of care (*CSW will initial, date and re-position this form in  chart as items are completed):  Yes   Patient/family provided with [facility] Work Department's list of facilities offering this level of care within the geographic area requested by the patient (or if unable, by the patient's family).  Yes   Patient/family informed of their freedom to choose among providers that offer the needed level of care, that participate in Medicare, Medicaid or managed care program needed by the patient, have an available bed and are willing to accept the patient.  Yes   Patient/family informed of [facility]'s ownership interest in [facility] and [facility], as well as of the fact that they are under no obligation to receive care at these facilities.  PASRR submitted to EDS on 08/09/15     PASRR number received on 08/09/15     Existing PASRR number confirmed on       FL2 transmitted to all facilities in geographic area requested by pt/family on 08/09/15     FL2 transmitted to all facilities within larger geographic area on       Patient informed that his/her managed care company has contracts with or will negotiate with certain facilities, including the following:            Patient/family informed of bed offers received.  Patient chooses bed at       Physician recommends and patient chooses bed at      Patient to be transferred to   on  .  Patient to be transferred to facility by       Patient family notified on   of transfer.  Name of family member notified:        PHYSICIAN Please sign FL2, Please sign DNR     Additional Comment:    _______________________________________________ Caroline Sauger,  LCSW 08/09/2015, 3:07 PM

## 2015-08-09 NOTE — Clinical Social Work Note (Signed)
Clinical Social Work Assessment  Patient Details  Name: Ronald Ochoa MRN: [id number] Date of Birth: 01-26-1921  Date of referral:  08/09/15               Reason for consult:  Facility Placement, Discharge Planning                Permission sought to share information with:  Family Supports, Customer service manager Permission granted to share information::  Yes, Verbal Permission Granted  Name::     Jiaire Rosebrook  Agency::  [facility]  Relationship::  Son  Contact Information:  [phone]  Housing/Transportation Living arrangements for the past 2 months:  [place] [employer]) Source of Information:  Adult Children Patient Interpreter Needed:  None Criminal Activity/Legal Involvement Pertinent to Current Situation/Hospitalization:  No - Comment as needed Significant Relationships:  Adult Children Lives with:  Facility Resident, Self Do you feel safe going back to the place where you live?  No (Fall risk) Need for family participation in patient care:  Yes (Comment) (Patient's son active in patient's care.)  Care giving concerns:  Patient's son expressed no concerns at this time.   Social Worker assessment / plan:  CSW received referral for possible SNF placement at time of discharge. CSW spoke with patient's son who informed CSW that patient is from [facility]. Patient's son agreeable to SNF placement and would prefer placement near [facility]. Patient's family anticipates patient to return to [facility] once patient completes short-term rehabilitation.   Employment status:  Retired Forensic scientist:  [employer]) PT Recommendations:  [place] / Referral to community resources:  [place]  Patient/Family's Response to care:  Patient's family understanding and agreeable to CSW plan of care.  Patient/Family's Understanding of and Emotional Response to  Diagnosis, Current Treatment, and Prognosis:  Patient's family understanding and agreeable to CSW plan of care.  Emotional Assessment Appearance:  Other (Comment Required (CSW spoke with patient's son.) Attitude/Demeanor/Rapport:   (CSW spoke with patient's son.) Affect (typically observed):   (CSW spoke with patient's son.) Orientation:  Oriented to Self Alcohol / Substance use:  Not Applicable Psych involvement (Current and /or in the community):  No (Comment) (Not appropriate on this admission.)  Discharge Needs  Concerns to be addressed:  No discharge needs identified Readmission within the last 30 days:  No Current discharge risk:  None Barriers to Discharge:  No Barriers Identified   Caroline Sauger, LCSW 08/09/2015, 3:05 PM [phone]

## 2015-08-09 NOTE — NC FL2 (Signed)
[place] LEVEL OF CARE SCREENING TOOL     IDENTIFICATION  Patient Name: Ronald Ochoa Birthdate: 10/29/1920 Sex: male Admission Date (Current Location): 08/08/2015  [facility] and [place] Number:  Herbalist and Address:  [facility],  [place] [address] [place]      Provider Number: [id number]  Attending Physician Name and Address:  Modena Jansky, MD  Relative Name and Phone Number:       Current Level of Care: Hospital Recommended Level of Care: [place] Prior Approval Number:    Date Approved/Denied:   PASRR Number: [id number]  Discharge Plan: SNF    Current Diagnoses: Patient Active Problem List   Diagnosis Date Noted  . Fracture of femoral neck, left ([place]) 08/08/2015  . Lung cancer ([place]) 08/08/2015  . Anemia 08/08/2015  . Thrombocytopenia ([place]) 08/08/2015  . CKD (chronic kidney disease) stage 3, GFR 30-59 ml/min 08/08/2015  . Femoral neck fracture ([place]) 08/08/2015  . S/P hip hemiarthroplasty 08/08/2015  . Paroxysmal atrial fibrillation ([place]) 12/02/2014  . On amiodarone therapy 12/02/2014  . Orthostatic hypotension 12/02/2014  . Heart murmur 12/02/2014  . Multiple lung nodules on CT 11/10/2014  . Right lower lobe lung mass 11/10/2014    Orientation RESPIRATION BLADDER Height & Weight     Self  Normal Continent Weight:   Height:     BEHAVIORAL SYMPTOMS/MOOD NEUROLOGICAL BOWEL NUTRITION STATUS      Continent Diet (Please see discharge summary.)  AMBULATORY STATUS COMMUNICATION OF NEEDS Skin    (Pivotal steps to chair only at this time. Prior to admission was walking with walker, per son.) Verbally Surgical wounds                       Personal Care Assistance Level of Assistance  Bathing, Feeding, Dressing Bathing Assistance: Maximum assistance Feeding assistance: Limited assistance Dressing Assistance: Maximum assistance     Functional Limitations Info             [place]  PT (By licensed PT), OT (By licensed OT)     PT Frequency: 5 OT Frequency: 5            Contractures      Additional Factors Info  Code Status, Allergies Code Status Info: DNR Allergies Info: No known allergies           Current Medications (08/09/2015):  This is the current hospital active medication list Current Facility-Administered Medications  Medication Dose Route Frequency Provider Last Rate Last Dose  . acetaminophen (TYLENOL) tablet 650 mg  650 mg Oral Q6H PRN Lanae Crumbly, PA-C       Or  . acetaminophen (TYLENOL) suppository 650 mg  650 mg Rectal Q6H PRN Lanae Crumbly, PA-C      . amiodarone (PACERONE) tablet 200 mg  200 mg Oral Daily Modena Jansky, MD   200 mg at 08/09/15 0934  . aspirin EC tablet 325 mg  325 mg Oral Q breakfast Lanae Crumbly, PA-C   325 mg at 08/09/15 4765  . ceFAZolin (ANCEF) IVPB 1 g/50 mL premix  1 g Intravenous Q8H Lanae Crumbly, PA-C   1 g at 08/09/15 1136  . docusate sodium (COLACE) capsule 100 mg  100 mg Oral BID Lanae Crumbly, PA-C   100 mg at 08/09/15 4650  . fludrocortisone (FLORINEF) tablet 0.1 mg  0.1 mg Oral Daily Modena Jansky, MD   0.1  mg at 08/09/15 0935  . menthol-cetylpyridinium (CEPACOL) lozenge 3 mg  1 lozenge Oral PRN Lanae Crumbly, PA-C       Or  . phenol (CHLORASEPTIC) mouth spray 1 spray  1 spray Mouth/Throat PRN Lanae Crumbly, PA-C      . metoCLOPramide (REGLAN) tablet 5-10 mg  5-10 mg Oral Q8H PRN Lanae Crumbly, PA-C       Or  . metoCLOPramide (REGLAN) injection 5-10 mg  5-10 mg Intravenous Q8H PRN Lanae Crumbly, PA-C      . morphine 2 MG/ML injection 0.5 mg  0.5 mg Intravenous Q2H PRN Modena Jansky, MD      . ondansetron [facility]) tablet 4 mg  4 mg Oral Q6H PRN Lanae Crumbly, PA-C       Or  . ondansetron [facility]) injection 4 mg  4 mg Intravenous Q6H PRN Lanae Crumbly, PA-C      . polyethylene glycol (MIRALAX / GLYCOLAX) packet 17 g  17 g Oral Daily PRN Lanae Crumbly, PA-C      .  pravastatin (PRAVACHOL) tablet 20 mg  20 mg Oral Daily Modena Jansky, MD   20 mg at 08/09/15 0934  . traMADol (ULTRAM) tablet 50 mg  50 mg Oral Q6H PRN Lanae Crumbly, PA-C   50 mg at 08/09/15 1154     Discharge Medications: Please see discharge summary for a list of discharge medications.  Relevant Imaging Results:  Relevant Lab Results:   Additional Information SSN: [id number]  Luna Kitchens [phone]

## 2015-08-10 ENCOUNTER — Encounter (HOSPITAL_COMMUNITY): Payer: Self-pay | Admitting: Orthopedic Surgery

## 2015-08-10 LAB — BASIC METABOLIC PANEL
ANION GAP: 5 (ref 5–15)
BUN: 34 mg/dL — AB (ref 4–21)
BUN: 34 mg/dL — AB (ref 6–20)
CALCIUM: 8.1 mg/dL — AB (ref 8.9–10.3)
CO2: 27 mmol/L (ref 22–32)
CREATININE: 1.24 mg/dL (ref 0.61–1.24)
Chloride: 112 mmol/L — ABNORMAL HIGH (ref 101–111)
Creatinine: 1.2 mg/dL (ref 0.6–1.3)
GFR calc Af Amer: 56 mL/min — ABNORMAL LOW (ref >60)
GFR, EST NON AFRICAN AMERICAN: 49 mL/min — AB (ref >60)
GLUCOSE: 130 mg/dL
GLUCOSE: 130 mg/dL — AB (ref 65–99)
Potassium: 4.3 mmol/L (ref 3.5–5.1)
SODIUM: 144 mmol/L (ref 137–147)
Sodium: 144 mmol/L (ref 135–145)

## 2015-08-10 LAB — TYPE AND SCREEN
ABO/RH(D): A POS
ANTIBODY SCREEN: NEGATIVE
UNIT DIVISION: 0
Unit division: 0

## 2015-08-10 LAB — CBC
HEMATOCRIT: 26.5 % — AB (ref 39.0–52.0)
Hemoglobin: 9.6 g/dL — ABNORMAL LOW (ref 13.0–17.0)
MCH: 31 pg (ref 26.0–34.0)
MCHC: 36.2 g/dL — AB (ref 30.0–36.0)
MCV: 85.5 fL (ref 78.0–100.0)
PLATELETS: 110 10*3/uL — AB (ref 150–400)
RBC: 3.1 MIL/uL — ABNORMAL LOW (ref 4.22–5.81)
RDW: 16.7 % — AB (ref 11.5–15.5)
WBC: 11.6 10*3/uL — AB (ref 4.0–10.5)

## 2015-08-10 LAB — GLUCOSE, CAPILLARY
Glucose-Capillary: 105 mg/dL — ABNORMAL HIGH (ref 65–99)
Glucose-Capillary: 105 mg/dL — ABNORMAL HIGH (ref 65–99)

## 2015-08-10 LAB — CBC AND DIFFERENTIAL: WBC: 11.6 10^3/mL

## 2015-08-10 MED ORDER — ASPIRIN 325 MG PO TBEC: 325.0000 mg | DELAYED_RELEASE_TABLET | Freq: Every day | ORAL | Status: AC

## 2015-08-10 MED ORDER — TRAMADOL HCL 50 MG PO TABS: 50.0000 mg | ORAL_TABLET | Freq: Four times a day (QID) | ORAL | Status: DC | PRN

## 2015-08-10 MED ORDER — DOCUSATE SODIUM 100 MG PO CAPS: 100.0000 mg | ORAL_CAPSULE | Freq: Two times a day (BID) | ORAL | Status: AC

## 2015-08-10 MED ORDER — POLYETHYLENE GLYCOL 3350 17 G PO PACK: 17.0000 g | PACK | Freq: Every day | ORAL | Status: AC | PRN

## 2015-08-10 NOTE — Clinical Social Work Placement (Signed)
   CLINICAL SOCIAL WORK PLACEMENT  NOTE  Date:  08/10/2015  Patient Details  Name: Ronald Ochoa MRN: [id number] Date of Birth: [birth year]  Clinical Social Work is seeking post-discharge placement for this patient at the [place] level of care (*CSW will initial, date and re-position this form in  chart as items are completed):  Yes   Patient/family provided with [place] Work Department's list of facilities offering this level of care within the geographic area requested by the patient (or if unable, by the patient's family).  Yes   Patient/family informed of their freedom to choose among providers that offer the needed level of care, that participate in Medicare, Medicaid or managed care program needed by the patient, have an available bed and are willing to accept the patient.  Yes   Patient/family informed of 's ownership interest in [facility] and [facility], as well as of the fact that they are under no obligation to receive care at these facilities.  PASRR submitted to EDS on 08/09/15     PASRR number received on 08/09/15     Existing PASRR number confirmed on       FL2 transmitted to all facilities in geographic area requested by pt/family on 08/09/15     FL2 transmitted to all facilities within larger geographic area on       Patient informed that his/her managed care company has contracts with or will negotiate with certain facilities, including the following:        Yes   Patient/family informed of bed offers received.  Patient chooses bed at [facility]     Physician recommends and patient chooses bed at      Patient to be transferred to [facility] on 08/10/15.  Patient to be transferred to facility by ambulance Corey Harold)     Patient family notified on 08/10/15 of transfer.  Name of family member notified:  pt son, [name] notified     PHYSICIAN Please sign FL2, Please sign DNR     Additional Comment:     _______________________________________________ Ladell Pier, LCSW 08/10/2015, 4:52 PM

## 2015-08-10 NOTE — Progress Notes (Signed)
Physical Therapy Treatment Patient Details Name: Ronald Ochoa MRN: [id number] DOB: 1920/05/14 Today's Date: 08/10/2015    History of Present Illness 82 y.o. male, widowed, lives ALF, ambulates with a walker and is independent of his activities of daily living, rides on a stationary bicycle daily,  presented to [facility] ED with complaints of left knee pain; xray revealed L hip fx--underwent DA L hip hemiarthroplasty;            PMH of A. fib-not on anticoagulation, hypercholesterolemia, stage III chronic kidney disease, orthostatic hypotension on fludrocortisone, lung cancer with bilateral pulmonary metastasis, right adrenal met the stasis and lytic lesions to L2 (as per PET scan 11/19/14) which patient has opted not to treat    PT Comments    Pt up in recliner with family present at beginning of session.  Pt scheduled to go to SNF this afternoon.  Performed therex in recliner and ice on L hip after session.  Pt not oriented to fact he had fx or was in hospital.  Con't to recommend SNF.  Follow Up Recommendations  SNF     Equipment Recommendations  None recommended by PT    Recommendations for Other Services       Precautions / Restrictions Restrictions LLE Weight Bearing: Weight bearing as tolerated    Mobility  Bed Mobility Overal bed mobility: Needs Assistance;+2 for physical assistance Bed Mobility: Supine to Sit     Supine to sit: Max assist;+2 for physical assistance;+2 for safety/equipment     General bed mobility comments: assist with trunk and LEs, bed pad used to scoot pt laterally and to EOB  Transfers Overall transfer level: Needs assistance Equipment used: Rolling walker (2 wheeled)   Sit to Stand: Min assist;+2 physical assistance Stand pivot transfers: Mod assist;+2 physical assistance;+2 safety/equipment       General transfer comment: pt is quite anxious and fearful of falling; multi-modal cues for sequencing and safety; +2 throughout for  balance, safety, maneuvering walker  Ambulation/Gait                 Stairs            Wheelchair Mobility    Modified Rankin (Stroke Patients Only)       Balance                                    Cognition Arousal/Alertness: Awake/alert Behavior During Therapy: WFL for tasks assessed/performed Overall Cognitive Status: Within Functional Limits for tasks assessed Area of Impairment: Orientation Orientation Level: Disoriented to;Situation;Place                  Exercises Total Joint Exercises Ankle Circles/Pumps: AAROM;Both;10 reps;Seated Quad Sets: Strengthening;Left;10 reps;Supine Gluteal Sets: Strengthening;Both;10 reps Heel Slides: AAROM;Left;10 reps;Supine Hip ABduction/ADduction: AAROM;Left;10 reps;Supine    General Comments        Pertinent Vitals/Pain Pain Assessment: Faces Pain Score: 3  Faces Pain Scale: Hurts little more Pain Location: L hip and lateral thigh Pain Descriptors / Indicators: Guarding;Operative site guarding Pain Intervention(s): Limited activity within patient's tolerance;Monitored during session;Ice applied    Home Living Family/patient expects to be discharged to:: Skilled nursing facility                    Prior Function            PT Goals (current goals can now be found in the care plan  section) Acute Rehab PT Goals Patient Stated Goal: none stated PT Goal Formulation: With patient Time For Goal Achievement: 08/14/15 Potential to Achieve Goals: Good Progress towards PT goals: Progressing toward goals    Frequency  Min 3X/week    PT Plan Current plan remains appropriate    Co-evaluation             End of Session   Activity Tolerance: Patient tolerated treatment well Patient left: in chair;with call bell/phone within reach;with chair alarm set     Time: 9211-9417 PT Time Calculation (min) (ACUTE ONLY): 16 min  Charges:  $Therapeutic Exercise: 8-22 mins                     G Codes:      [place] LUBECK 08/10/2015, 2:50 PM

## 2015-08-10 NOTE — Clinical Social Work Placement (Signed)
   CLINICAL SOCIAL WORK PLACEMENT  NOTE  Date:  08/10/2015  Patient Details  Name: Ronald Ochoa MRN: [id number] Date of Birth: 01-15-1921  Clinical Social Work is seeking post-discharge placement for this patient at the [place] level of care (*CSW will initial, date and re-position this form in  chart as items are completed):  Yes   Patient/family provided with [place] Work Department's list of facilities offering this level of care within the geographic area requested by the patient (or if unable, by the patient's family).  Yes   Patient/family informed of their freedom to choose among providers that offer the needed level of care, that participate in Medicare, Medicaid or managed care program needed by the patient, have an available bed and are willing to accept the patient.  Yes   Patient/family informed of [facility]'s ownership interest in [facility] and [facility], as well as of the fact that they are under no obligation to receive care at these facilities.  PASRR submitted to EDS on 08/09/15     PASRR number received on 08/09/15     Existing PASRR number confirmed on       FL2 transmitted to all facilities in geographic area requested by pt/family on 08/09/15     FL2 transmitted to all facilities within larger geographic area on       Patient informed that his/her managed care company has contracts with or will negotiate with certain facilities, including the following:        Yes   Patient/family informed of bed offers received.  Patient chooses bed at [facility]     Physician recommends and patient chooses bed at      Patient to be transferred to [facility] on  .  Patient to be transferred to facility by       Patient family notified on   of transfer.  Name of family member notified:        PHYSICIAN Please sign FL2, Please sign DNR     Additional Comment:     _______________________________________________ Ladell Pier, LCSW 08/10/2015, 11:46 AM

## 2015-08-10 NOTE — Progress Notes (Signed)
Subjective: Patient stable pain controlled   Objective: Vital signs in last 24 hours: Temp:  [97.7 F (36.5 C)-97.8 F (36.6 C)] 97.7 F (36.5 C) (04/24 0634) Pulse Rate:  [62-71] 71 (04/24 0634) Resp:  [16-18] 18 (04/24 0634) BP: (124-148)/(40-61) 148/56 mmHg (04/24 0634) SpO2:  [94 %-98 %] 94 % (04/24 0634)  Intake/Output from previous day: 04/23 0701 - 04/24 0700 In: 620 [P.O.:620] Out: 325 [Urine:325] Intake/Output this shift:    Exam:  Intact pulses distally Dorsiflexion/Plantar flexion intact  Labs:  Recent Labs  08/08/15 1713 08/09/15 0651 08/10/15 0424  HGB 11.9* 11.3* 9.6*    Recent Labs  08/09/15 0651 08/10/15 0424  WBC 9.8 11.6*  RBC 3.89* 3.10*  HCT 33.7* 26.5*  PLT 123* 110*    Recent Labs  08/09/15 0651 08/10/15 0424  NA 142 144  K 3.9 4.3  CL 107 112*  CO2 27 27  BUN 29* 34*  CREATININE 1.28* 1.24  GLUCOSE 229* 130*  CALCIUM 8.1* 8.1*   No results for input(s): LABPT, INR in the last 72 hours.  Assessment/Plan: Patient doing reasonably well following hip hemiarthroplasty for fracture on the left-hand side there is some bruising and ecchymosis ran the incision.  Plan for new dressing today L with discharge to skilled nursing facility once a bed is available   DEAN,GREGORY SCOTT 08/10/2015, 7:19 AM

## 2015-08-10 NOTE — Progress Notes (Signed)
CSW continuing to follow.   CSW spoke with pt son, [name] via telephone this morning to provide SNF bed offers. Pt son chooses bed at [facility]. Pt son discussed that he was told yesterday that pt would not discharge today. CSW discussed that CSW awaiting MD to round, but will update pt son when CSW receives clarification when pt may be medically ready for discharge.  CSW contacted [facility] and confirmed facility can accept pt when medically ready.  CSW to obtain [employer] Authorization once determined when pt medically ready for discharge.   CSW to continue to follow to provide support and assist with pt discharge to [facility] when medically ready.  Alison Murray, MSW, LCSW Clinical Social Work Coverage for [employer], [place]

## 2015-08-10 NOTE — Clinical Documentation Improvement (Signed)
Hospitalist  Can the diagnosis of pathological fracture vs left hip fracture be clarified?   Document the etiology - osteoporosis, disuse, drug-induced, postmenopausal, idiopathic, postsurgical malabsorption, neoplastic disease, other, unable to clinically determine  Other  Clinically Undetermined  Document any associated diagnoses/conditions.   Supporting Information: Fracture of left femoral neck - Possibly from significant osteoporosis and may have twisted during ambulation. -No reported fall or direct trauma. Other differential diagnosis is pathological fracture from known metastatic lung cancer per 4/22 progress notes. POST-OPERATIVE DIAGNOSIS: Left hip fracture    Please exercise your independent, professional judgment when responding. A specific answer is not anticipated or expected.   Thank You,  [place] [phone]

## 2015-08-10 NOTE — Discharge Summary (Signed)
Physician Discharge Summary  Ronald Ochoa  KVQ:[id number]  DOB: 1920/09/27  DOA: 08/08/2015  PCP: Ronald Argyle, MD  Outpatient Specialists:  Cardiology: Dr. Candee Ochoa  Admit date: 08/08/2015 Discharge date: 08/10/2015  Time spent: Greater than 30 minutes  Recommendations for Outpatient Follow-up:  1. M.D. at SNF in 2 days with repeat labs (CBC & BMP). 2. Dr. Meredith Ochoa, Orthopedics in 2 weeks. SNF to arrange follow-up appointment. 3. Dr. Lajean Ochoa, PCP: upon discharge from SNF.  Discharge Diagnoses:  Principal Problem:   Fracture of femoral neck, left (HCC) Active Problems:   Paroxysmal atrial fibrillation (HCC)   Orthostatic hypotension   Lung cancer (HCC)   Anemia   Thrombocytopenia (HCC)   CKD (chronic kidney disease) stage 3, GFR 30-59 ml/min   Femoral neck fracture (HCC)   S/P hip hemiarthroplasty   Discharge Condition: Improved & Stable  Diet recommendation: Heart healthy diet.  Filed Weights   08/10/15 0820  Weight: 71.6 kg (157 lb 13.6 oz)    History of present illness:  82 y.o. male, widowed, lives at an independent living facility, ambulates with a walker and is independent of his activities of daily living, rides on a stationary bicycle daily, PMH of A. fib-not on anticoagulation, hypercholesterolemia, stage III chronic kidney disease, orthostatic hypotension on fludrocortisone, lung cancer with bilateral pulmonary metastasis, right adrenal met the stasis and lytic lesions to L2 (as per PET scan 11/19/14) which patient has opted not to treat, presented to [facility] ED with complaints of left knee pain. Diagnosed with left femoral neck fracture. Orthopedics was consulted and patient underwent hemi-arthroplasty on night of admission.  Hospital Course:   Fracture of left femoral neck, s/p left hip hemiarthroplasty 4/22. - Possibly from significant osteoporosis and may have twisted during ambulation. No reported fall or direct  trauma. Other differential diagnosis is pathological fracture from known metastatic lung cancer.  - Orthopedics was consulted and patient underwent hemi-arthroplasty on night of admission (4/22). - As per orthopedics, weightbearing as tolerated, aspirin 325 MG daily 30 days for postop DVT prophylaxis, keep surgical wound clean and dry, outpatient follow-up in 2 weeks. Orthopedics has cleared for discharge to SNF. - Improved and stable.  Paroxysmal atrial fibrillation - Continue amiodarone. Patient is not on anticoagulation or antiplatelets, likely secondary to bleeding risk.  Orthostatic hypotension - Continue fludrocortisone. Hemodynamically stable at this time.  Acute Blood Loss Anemia complicating chronic Anemia - Operative EBL: 600 ml. Transfused 2 units PRBC's intraoperatively. Hemoglobin has dropped from 11.3 > 9.6. May be equilibration. No overt bleeding noticed. Follow CBC at SNF in the next 48 hours.  Thrombocytopenia - relatively stable. No prior labs to compare. Don't know if this is chronic or not. Follow CBC in the next 48 hours.  Stage III chronic kidney disease - Creatinine improved from 1.44 on admission to 1.24. Follow BMP periodically.  Hyperlipidemia - Continue statins.  Stage IV lung cancer - PET scan 11/19/14: Central right mid lung mass is intensely hypermetabolic compatible with primary bronchogenic carcinoma, bilateral pulmonary metastasis, right adrenal gland metastasis stasis and lytic metastasis to the L2 vertebrae. - Patient opted no treatment.  ? Prolonged QTC - repeat EKG 4/23: QTC 442 ms. Resolved.  Confusion - Patient was confused on 4/23 which seems to have resolved and mental status seems at baseline.  DO NOT RESUSCITATE   Consultants:   Orthopedics.  Procedures:   Left hip hemi-arthroplasty 4/22  Foley-discontinued 4/23   Discharge Exam:  Complaints: Left hip pain  rated at 5/10 in severity. Denies any other complaints. Denies  chest pain or dyspnea. As per RN, no acute issues.  Filed Vitals:   08/09/15 1434 08/09/15 2143 08/10/15 0634 08/10/15 0820  BP: 130/47 147/61 148/56   Pulse: 62 68 71   Temp: 97.8 F (36.6 C) 97.8 F (36.6 C) 97.7 F (36.5 C)   TempSrc: Oral Oral Oral   Resp: 18 18 18    Height:    5' 10"  (1.778 m)  Weight:    71.6 kg (157 lb 13.6 oz)  SpO2: 98% 95% 94%     General exam: Pleasant elderly male lying comfortably propped up in bed. Respiratory system: Clear. No increased work of breathing. Cardiovascular system: S1 & S2 heard, RRR. No JVD, murmurs, gallops, clicks or pedal edema. Gastrointestinal system: Abdomen is nondistended, soft and nontender. Normal bowel sounds heard. Central nervous system: Alert and oriented 2. No focal neurological deficits. Extremities: Symmetric 5 x 5 power. Left hip postop site dressing clean and dry. Mild patchy bruising around the surgical site but no overt bleeding.  Discharge Instructions      Discharge Instructions    Call MD / Call 911    Complete by:  As directed   If you experience chest pain or shortness of breath, CALL 911 and be transported to the hospital emergency room.  If you develope a fever above 101 F, pus (white drainage) or increased drainage or redness at the wound, or calf pain, call your surgeon's office.     Call MD for:  difficulty breathing, headache or visual disturbances    Complete by:  As directed      Call MD for:  extreme fatigue    Complete by:  As directed      Call MD for:  persistant dizziness or light-headedness    Complete by:  As directed      Call MD for:  persistant nausea and vomiting    Complete by:  As directed      Call MD for:  redness, tenderness, or signs of infection (pain, swelling, redness, odor or green/yellow discharge around incision site)    Complete by:  As directed      Call MD for:  severe uncontrolled pain    Complete by:  As directed      Call MD for:  temperature >100.4    Complete by:   As directed      Constipation Prevention    Complete by:  As directed   Drink plenty of fluids.  Prune juice may be helpful.  You may use a stool softener, such as Colace (over the counter) 100 mg twice a day.  Use MiraLax (over the counter) for constipation as needed.     Diet - low sodium heart healthy    Complete by:  As directed      Discharge instructions    Complete by:  As directed   Weightbearing as tolerated Keep incision dry Return to clinic in 2 weeks for recheck  [place] items at home which could result in a fall. This includes throw rugs or furniture in walking pathways ICE to the affected joint every three hours while awake for 30 minutes at a time, for at least the first 3-5 days, and then as needed for pain and swelling.  Continue to use ice for pain and swelling. You may notice swelling that will progress down to the foot and ankle.  This is  normal after surgery.  Elevate your leg when you are not up walking on it.   Continue to use the breathing machine you got in the hospital (incentive spirometer) which will help keep your temperature down.  It is common for your temperature to cycle up and down following surgery, especially at night when you are not up moving around and exerting yourself.  The breathing machine keeps your lungs expanded and your temperature down.   DIET:  As you were doing prior to hospitalization, we recommend a well-balanced diet.  DRESSING / WOUND CARE / SHOWERING  Keep the surgical dressing until follow up.  The dressing is water proof, so you can shower without any extra covering.  IF THE DRESSING FALLS OFF or the wound gets wet inside, change the dressing with sterile gauze.  Please use good hand washing techniques before changing the dressing.  Do not use any lotions or creams on the incision until instructed by your surgeon.    ACTIVITY  Increase activity slowly as tolerated, but follow the weight bearing  instructions below.   No driving for 6 weeks or until further direction given by your physician.  You cannot drive while taking narcotics.  No lifting or carrying greater than 10 lbs. until further directed by your surgeon. Avoid periods of inactivity such as sitting longer than an hour when not asleep. This helps prevent blood clots.  You may return to work once you are authorized by your doctor.     WEIGHT BEARING   Weight bearing as tolerated with assist device (walker, cane, etc) as directed, use it as long as suggested by your surgeon or therapist, typically at least 4-6 weeks.   EXERCISES  Results after joint replacement surgery are often greatly improved when you follow the exercise, range of motion and muscle strengthening exercises prescribed by your doctor. Safety measures are also important to protect the joint from further injury. Any time any of these exercises cause you to have increased pain or swelling, decrease what you are doing until you are comfortable again and then slowly increase them. If you have problems or questions, call your caregiver or physical therapist for advice.   Rehabilitation is important following a joint replacement. After just a few days of immobilization, the muscles of the leg can become weakened and shrink (atrophy).  These exercises are designed to build up the tone and strength of the thigh and leg muscles and to improve motion. Often times heat used for twenty to thirty minutes before working out will loosen up your tissues and help with improving the range of motion but do not use heat for the first two weeks following surgery (sometimes heat can increase post-operative swelling).   These exercises can be done on a training (exercise) mat, on the floor, on a table or on a bed. Use whatever works the best and is most comfortable for you.    Use music or television while you are exercising so that the exercises are a pleasant break in your day. This  will make your life better with the exercises acting as a break in your routine that you can look forward to.   Perform all exercises about fifteen times, three times per day or as directed.  You should exercise both the operative leg and the other leg as well.   Exercises include:   Quad Sets - Tighten up the muscle on the front of the thigh (Quad) and hold for 5-10 seconds.   Straight  Leg Raises - With your knee straight (if you were given a brace, keep it on), lift the leg to 60 degrees, hold for 3 seconds, and slowly lower the leg.  Perform this exercise against resistance later as your leg gets stronger.  Leg Slides: Lying on your back, slowly slide your foot toward your buttocks, bending your knee up off the floor (only go as far as is comfortable). Then slowly slide your foot back down until your leg is flat on the floor again.  Angel Wings: Lying on your back spread your legs to the side as far apart as you can without causing discomfort.  Hamstring Strength:  Lying on your back, push your heel against the floor with your leg straight by tightening up the muscles of your buttocks.  Repeat, but this time bend your knee to a comfortable angle, and push your heel against the floor.  You may put a pillow under the heel to make it more comfortable if necessary.   A rehabilitation program following joint replacement surgery can speed recovery and prevent re-injury in the future due to weakened muscles. Contact your doctor or a physical therapist for more information on knee rehabilitation.    CONSTIPATION  Constipation is defined medically as fewer than three stools per week and severe constipation as less than one stool per week.  Even if you have a regular bowel pattern at home, your normal regimen is likely to be disrupted due to multiple reasons following surgery.  Combination of anesthesia, postoperative narcotics, change in appetite and fluid intake all can affect your bowels.   YOU MUST use  at least one of the following options; they are listed in order of increasing strength to get the job done.  They are all available over the counter, and you may need to use some, POSSIBLY even all of these options:    Drink plenty of fluids (prune juice may be helpful) and high fiber foods Colace 100 mg by mouth twice a day  Senokot for constipation as directed and as needed Dulcolax (bisacodyl), take with full glass of water  Miralax (polyethylene glycol) once or twice a day as needed.  If you have tried all these things and are unable to have a bowel movement in the first 3-4 days after surgery call either your surgeon or your primary doctor.    If you experience loose stools or diarrhea, hold the medications until you stool forms back up.  If your symptoms do not get better within 1 week or if they get worse, check with your doctor.  If you experience "the worst abdominal pain ever" or develop nausea or vomiting, please contact the office immediately for further recommendations for treatment.   ITCHING:  If you experience itching with your medications, try taking only a single pain pill, or even half a pain pill at a time.  You can also use Benadryl over the counter for itching or also to help with sleep.   TED HOSE STOCKINGS:  Use stockings on both legs until for at least 2 weeks or as directed by physician office. They may be removed at night for sleeping.  MEDICATIONS:  See your medication summary on the "After Visit Summary" that nursing will review with you.  You may have some home medications which will be placed on hold until you complete the course of blood thinner medication.  It is important for you to complete the blood thinner medication as prescribed.  PRECAUTIONS:  If you  experience chest pain or shortness of breath - call 911 immediately for transfer to the hospital emergency department.   If you develop a fever greater that 101 F, purulent drainage from wound, increased  redness or drainage from wound, foul odor from the wound/dressing, or calf pain - CONTACT YOUR SURGEON.                                                   FOLLOW-UP APPOINTMENTS:  If you do not already have a post-op appointment, please call the office for an appointment to be seen by your surgeon.  Guidelines for how soon to be seen are listed in your "After Visit Summary", but are typically between 1-4 weeks after surgery.  OTHER INSTRUCTIONS:   Knee Replacement:  Do not place pillow under knee, focus on keeping the knee straight while resting. CPM instructions: 0-90 degrees, 2 hours in the morning, 2 hours in the afternoon, and 2 hours in the evening. Place foam block, curve side up under heel at all times except when in CPM or when walking.  DO NOT modify, tear, cut, or change the foam block in any way.  MAKE SURE YOU:  Understand these instructions.  Get help right away if you are not doing well or get worse.    Thank you for letting us be a part of your medical care team.  It is a privilege we respect greatly.  We hope these instructions will help you stay on track for a fast and full recovery!     Increase activity slowly as tolerated    Complete by:  As directed             Medication List    TAKE these medications        amiodarone 200 MG tablet  Commonly known as:  PACERONE  Take 1 tablet (200 mg total) by mouth daily.     aspirin 325 MG EC tablet  Take 1 tablet (325 mg total) by mouth daily with breakfast. To be taken for 30 days postop, then discontinue.     docusate sodium 100 MG capsule  Commonly known as:  COLACE  Take 1 capsule (100 mg total) by mouth 2 (two) times daily.     DRAMAMINE PO  Take 1 tablet by mouth daily as needed (dizzyness).     fludrocortisone 0.1 MG tablet  Commonly known as:  FLORINEF  Take 0.1 mg by mouth daily.     polyethylene glycol packet  Commonly known as:  MIRALAX / GLYCOLAX  Take 17 g by mouth daily as needed for mild  constipation.     pravastatin 20 MG tablet  Commonly known as:  PRAVACHOL  Take 20 mg by mouth daily.     traMADol 50 MG tablet  Commonly known as:  ULTRAM  Take 1 tablet (50 mg total) by mouth every 6 (six) hours as needed for moderate pain or severe pain.         Get Medicines reviewed and adjusted: Please take all your medications with you for your next visit with your Primary MD  Please request your Primary MD to go over all hospital tests and procedure/radiological results at the follow up. Please ask your Primary MD to get all Hospital records sent to his/her office.  If you experience worsening of your admission symptoms,  develop shortness of breath, life threatening emergency, suicidal or homicidal thoughts you must seek medical attention immediately by calling 911 or calling your MD immediately if symptoms less severe.  You must read complete instructions/literature along with all the possible adverse reactions/side effects for all the Medicines you take and that have been prescribed to you. Take any new Medicines after you have completely understood and accept all the possible adverse reactions/side effects.   Do not drive when taking pain medications.   Do not take more than prescribed Pain, Sleep and Anxiety Medications  Special Instructions: If you have smoked or chewed Tobacco in the last 2 yrs please stop smoking, stop any regular Alcohol and or any Recreational drug use.  Wear Seat belts while driving.  Please note  You were cared for by a hospitalist during your hospital stay. Once you are discharged, your primary care physician will handle any further medical issues. Please note that NO REFILLS for any discharge medications will be authorized once you are discharged, as it is imperative that you return to your primary care physician (or establish a relationship with a primary care physician if you do not have one) for your aftercare needs so that they can  reassess your need for medications and monitor your lab values.    The results of significant diagnostics from this hospitalization (including imaging, microbiology, ancillary and laboratory) are listed below for reference.    Significant Diagnostic Studies: Dg Pelvis Portable  08/08/2015  CLINICAL DATA:  Postop anterior approach hemiarthroplasty EXAM: PORTABLE PELVIS 1-2 VIEWS COMPARISON:  Left hip radiographs dated 08/08/2015 at 1620 hours FINDINGS: Left hip hemiarthroplasty in satisfactory position. Associated subcutaneous gas. No fracture or dislocation is seen. Visualized bony pelvis appears intact. Mild degenerative changes of the right hip. IMPRESSION: Left hip hemiarthroplasty in satisfactory position. Electronically Signed   By: Julian Hy M.D.   On: 08/08/2015 23:54   Dg Chest Port 1 View  08/08/2015  CLINICAL DATA:  Known left hip fracture, recent fall EXAM: PORTABLE CHEST 1 VIEW COMPARISON:  11/19/2014 FINDINGS: Cardiac shadow is within normal limits. Aortic calcifications are noted. Right hilar mass lesion is noted consistent with the patient's known lung carcinoma. No new focal mass lesion is seen. No infiltrate is noted. Degenerative changes of thoracic spine are seen. IMPRESSION: Changes consistent with the known history of lung carcinoma. No acute abnormality is noted. Electronically Signed   By: Inez Catalina M.D.   On: 08/08/2015 17:30   Dg C-arm 1-60 Min-no Report  08/08/2015  CLINICAL DATA: perioperative use of fluoro for hip fracture repair C-ARM 1-60 MINUTES Fluoroscopy was utilized by the requesting physician.  No radiographic interpretation.   Dg Hip Operative Unilat W Or W/o Pelvis Left  08/08/2015  CLINICAL DATA:  New left hip arthroplasty EXAM: OPERATIVE LEFT HIP (WITH PELVIS IF PERFORMED)  VIEWS TECHNIQUE: Fluoroscopic spot image(s) were submitted for interpretation post-operatively. FLUOROSCOPY TIME:  21.8 seconds COMPARISON:  Left hip radiographs dated 08/08/2015  FINDINGS: Intraoperative fluoroscopic images during left hip hemiarthroplasty. Surgical hardware in anatomic position. IMPRESSION: Intraoperative fluoroscopic images during left hip hemiarthroplasty. Electronically Signed   By: Julian Hy M.D.   On: 08/08/2015 23:22   Dg Hip Unilat With Pelvis 2-3 Views Left  08/08/2015  CLINICAL DATA:  Fall today with left hip pain EXAM: DG HIP (WITH OR WITHOUT PELVIS) 2-3V LEFT COMPARISON:  None. FINDINGS: Nondisplaced comminuted impacted left mid femoral neck fracture with mild apex lateral angulation. No additional fracture. No hip dislocation. Mild-to-moderate  osteoarthritis in the weight-bearing portions of both hip joints. Marked degenerative changes in the visualized lower lumbar spine. IMPRESSION: Nondisplaced comminuted impacted left mid femoral neck fracture. Electronically Signed   By: Ilona Sorrel M.D.   On: 08/08/2015 16:37    Microbiology: Recent Results (from the past 240 hour(s))  MRSA PCR Screening     Status: None   Collection Time: 08/09/15  6:30 AM  Result Value Ref Range Status   MRSA by PCR NEGATIVE NEGATIVE Final    Comment:        The GeneXpert MRSA Assay (FDA approved for NASAL specimens only), is one component of a comprehensive MRSA colonization surveillance program. It is not intended to diagnose MRSA infection nor to guide or monitor treatment for MRSA infections.      Labs: Basic Metabolic Panel:  Recent Labs Lab 08/08/15 1713 08/09/15 0651 08/10/15 0424  NA 145 142 144  K 3.5 3.9 4.3  CL 109 107 112*  CO2 26 27 27   GLUCOSE 93 229* 130*  BUN 29* 29* 34*  CREATININE 1.44* 1.28* 1.24  CALCIUM 8.6* 8.1* 8.1*   Liver Function Tests: No results for input(s): AST, ALT, ALKPHOS, BILITOT, PROT, ALBUMIN in the last 168 hours. No results for input(s): LIPASE, AMYLASE in the last 168 hours. No results for input(s): AMMONIA in the last 168 hours. CBC:  Recent Labs Lab 08/08/15 1713 08/09/15 0651  08/10/15 0424  WBC 8.5 9.8 11.6*  NEUTROABS 6.6  --   --   HGB 11.9* 11.3* 9.6*  HCT 36.4* 33.7* 26.5*  MCV 93.3 86.6 85.5  PLT 142* 123* 110*   Cardiac Enzymes: No results for input(s): CKTOTAL, CKMB, CKMBINDEX, TROPONINI in the last 168 hours. BNP: BNP (last 3 results) No results for input(s): BNP in the last 8760 hours.  ProBNP (last 3 results) No results for input(s): PROBNP in the last 8760 hours.  CBG:  Recent Labs Lab 08/09/15 1800 08/10/15 0724 08/10/15 1156  GLUCAP 148* 105* 105*       Signed:  Vernell Leep, MD, FACP, FHM. [facility] Pager [phone]  If 7PM-7AM, please contact night-coverage [email or web address] Password [email or web address] 08/10/2015, 1:41 PM

## 2015-08-10 NOTE — Progress Notes (Signed)
Was able to reach the nurse administrator, Kathlee Nations, to give report on the patient. Patient transported by [facility].

## 2015-08-10 NOTE — Evaluation (Signed)
Occupational Therapy Evaluation Patient Details Name: Ronald Ochoa MRN: [id number] DOB: [birth year] Today's Date: 08/10/2015    History of Present Illness 82 y.o. male, widowed, lives ALF, ambulates with a walker and is independent of his activities of daily living, rides on a stationary bicycle daily,  presented to [facility] ED with complaints of left knee pain; xray revealed L hip fx--underwent DA L hip hemiarthroplasty;            PMH of A. fib-not on anticoagulation, hypercholesterolemia, stage III chronic kidney disease, orthostatic hypotension on fludrocortisone, lung cancer with bilateral pulmonary metastasis, right adrenal met the stasis and lytic lesions to L2 (as per PET scan 11/19/14) which patient has opted not to treat   Clinical Impression   Pt admitted with L hip fracture. Pt currently with functional limitations due to the deficits listed below (see OT Problem List).  Pt will benefit from skilled OT to increase their safety and independence with ADL and functional mobility for ADL to facilitate discharge to venue listed below.      Follow Up Recommendations  SNF    Equipment Recommendations  None recommended by OT       Precautions / Restrictions Restrictions LLE Weight Bearing: Weight bearing as tolerated      Mobility Bed Mobility Overal bed mobility: Needs Assistance;+2 for physical assistance Bed Mobility: Supine to Sit     Supine to sit: Max assist;+2 for physical assistance;+2 for safety/equipment     General bed mobility comments: assist with trunk and LEs, bed pad used to scoot pt laterally and to EOB  Transfers Overall transfer level: Needs assistance Equipment used: Rolling walker (2 wheeled)   Sit to Stand: Min assist;+2 physical assistance Stand pivot transfers: Mod assist;+2 physical assistance;+2 safety/equipment       General transfer comment: pt is quite anxious and fearful of falling; multi-modal cues for sequencing and safety;  +2 throughout for balance, safety, maneuvering walker         ADL Overall ADL's : Needs assistance/impaired Eating/Feeding: Set up;Sitting   Grooming: Oral care;Set up;Sitting   Upper Body Bathing: Set up;Sitting               Toilet Transfer: Moderate assistance;RW Toilet Transfer Details (indicate cue type and reason): sit to stand Toileting- Clothing Manipulation and Hygiene: Maximal assistance;Sit to/from stand;Cueing for sequencing;Cueing for safety                         Pertinent Vitals/Pain Pain Score: 3  Faces Pain Scale: Hurts little more Pain Location: L hip Pain Descriptors / Indicators: Sore Pain Intervention(s): Monitored during session;Repositioned     Hand Dominance     Extremity/Trunk Assessment Upper Extremity Assessment Upper Extremity Assessment: Generalized weakness           Communication     Cognition Arousal/Alertness: Awake/alert Behavior During Therapy: Agitated Overall Cognitive Status: Within Functional Limits for tasks assessed                     General Comments               Home Living Family/patient expects to be discharged to:: Skilled nursing facility                                             OT Diagnosis:  Generalized weakness   OT Problem List: Decreased strength;Decreased activity tolerance;Pain   OT Treatment/Interventions: Self-care/ADL training;DME and/or AE instruction;Patient/family education    OT Goals(Current goals can be found in the care plan section) Acute Rehab OT Goals Patient Stated Goal: none stated Time For Goal Achievement: 08/17/15 Potential to Achieve Goals: Good  OT Frequency: Min 2X/week              End of Session Equipment Utilized During Treatment: Rolling walker Nurse Communication: Mobility status  Activity Tolerance: Patient tolerated treatment well Patient left: in chair;with call bell/phone within reach   Time: 1135-1154 OT Time  Calculation (min): 19 min Charges:  OT Evaluation $OT Eval Moderate Complexity: 1 Procedure G-Codes:    Payton Mccallum D 2015-09-06, 12:15 PM

## 2015-08-10 NOTE — Progress Notes (Signed)
Pt d/c to [facility]. Attempted to call report x2, and was unable to connect with the nurse. Patient is being transported via [place].

## 2015-08-10 NOTE — Progress Notes (Signed)
Pt for discharge to [facility].   CSW facilitated pt discharge needs including contacting facility, faxing pt discharge information via epic hub, discussing with pt son, [name], providing RN phone number to call report, receiving [employer] insurance authorization (auth#: [id number]), and arranging ambulance transport via [facility] for pt to [facility].  No further social work needs identified at this time.  CSW signing off.   Alison Murray, MSW, [place] Work [phone]

## 2015-08-11 ENCOUNTER — Encounter: Payer: Self-pay | Admitting: Internal Medicine

## 2015-08-11 ENCOUNTER — Non-Acute Institutional Stay (SKILLED_NURSING_FACILITY): Payer: PPO | Admitting: Internal Medicine

## 2015-08-11 DIAGNOSIS — R5381 Other malaise: Secondary | ICD-10-CM | POA: Diagnosis not present

## 2015-08-11 DIAGNOSIS — D72829 Elevated white blood cell count, unspecified: Secondary | ICD-10-CM | POA: Diagnosis not present

## 2015-08-11 DIAGNOSIS — N183 Chronic kidney disease, stage 3 unspecified: Secondary | ICD-10-CM

## 2015-08-11 DIAGNOSIS — C3490 Malignant neoplasm of unspecified part of unspecified bronchus or lung: Secondary | ICD-10-CM | POA: Diagnosis not present

## 2015-08-11 DIAGNOSIS — D62 Acute posthemorrhagic anemia: Secondary | ICD-10-CM

## 2015-08-11 DIAGNOSIS — D696 Thrombocytopenia, unspecified: Secondary | ICD-10-CM

## 2015-08-11 DIAGNOSIS — S72002D Fracture of unspecified part of neck of left femur, subsequent encounter for closed fracture with routine healing: Secondary | ICD-10-CM

## 2015-08-11 DIAGNOSIS — K5900 Constipation, unspecified: Secondary | ICD-10-CM

## 2015-08-11 DIAGNOSIS — I951 Orthostatic hypotension: Secondary | ICD-10-CM | POA: Diagnosis not present

## 2015-08-11 DIAGNOSIS — E785 Hyperlipidemia, unspecified: Secondary | ICD-10-CM

## 2015-08-11 DIAGNOSIS — I480 Paroxysmal atrial fibrillation: Secondary | ICD-10-CM | POA: Diagnosis not present

## 2015-08-11 NOTE — Progress Notes (Signed)
LOCATION: [place]  PCP: [name], MD   Code Status: DNR  Goals of care: Advanced Directive information Advanced Directives 08/11/2015  Does patient have an advance directive? Yes  Type of Advance Directive Out of facility DNR (pink MOST or yellow form)  Does patient want to make changes to advanced directive? No - Patient declined  Copy of advanced directive(s) in chart? Yes       Extended Emergency Contact Information Primary Emergency Contact: Baumgarner,Peter Address: [address] [place], [place] [address] Johnnette Litter of [place] Phone: [phone] Relation: Son Secondary Emergency Contact: Jackie Plum Address: [address] [place], [place] [address] Johnnette Litter of [place] Phone: [phone] Relation: Relative   No Known Allergies  Chief Complaint  Patient presents with  . New Admit To SNF    New Admission     HPI:  Patient is a 82 y.o. male seen today for short term rehabilitation post hospital admission from 08/08/15-08/10/15 with left femoral neck fracture post fall. He underwent left hip hemiarthroplasty. He had acute blood loss anemia and got 2 u prbc transfusion. He has history of afib, lung cancer stage 4, afib, orthostatic hypotension among others. He is seen in his room today.   Review of Systems:  Constitutional: Negative for fever, chills, diaphoresis. Feels weak and tired.  HENT: Negative for headache, congestion, nasal discharge, sore throat, difficulty swallowing.   Eyes: Negative for blurred vision, double vision and discharge. Wears glasses.  Respiratory: Negative for cough, shortness of breath and wheezing.   Cardiovascular: Negative for chest pain, palpitations, leg swelling.  Gastrointestinal: Negative for heartburn, nausea, vomiting, abdominal pain. Doesn't remember when his last bowel movement was.  Genitourinary: Negative for dysuria and flank pain.  Musculoskeletal: Negative for back  pain, fall in the facility.  Skin: Negative for itching, rash.  Neurological:  Positive for dizziness. Psychiatric/Behavioral: Negative for depression   Past Medical History  Diagnosis Date  . Atrial fibrillation ([place])   . Hypercholesterolemia   . Chronic renal failure, stage 3 (moderate)   . Multiple lung nodules on CT    Past Surgical History  Procedure Laterality Date  . Appendectomy  1943  . Anterior approach hemi hip arthroplasty Left 08/08/2015    Procedure: ANTERIOR APPROACH HEMI HIP ARTHROPLASTY;  Surgeon: [name], MD;  Location: [facility]S;  Service: Orthopedics;  Laterality: Left;   Social History:   reports that he quit smoking about 62 years ago. His smoking use included Cigarettes. He has a 7.5 pack-year smoking history. He does not have any smokeless tobacco history on file. He reports that he does not drink alcohol or use illicit drugs.  Family History  Problem Relation Age of Onset  . CAD Father   . Cancer Mother   . Colon cancer Sister   . Liver cancer Daughter   . Hepatitis C Daughter   . Melanoma Son     Medications:   Medication List       This list is accurate as of: 08/11/15  2:27 PM.  Always use your most recent med list.               amiodarone 200 MG tablet  Commonly known as:  PACERONE  Take 1 tablet (200 mg total) by mouth daily.     aspirin 325 MG EC tablet  Take 1 tablet (325 mg total) by  mouth daily with breakfast. To be taken for 30 days postop, then discontinue.     docusate sodium 100 MG capsule  Commonly known as:  COLACE  Take 1 capsule (100 mg total) by mouth 2 (two) times daily.     DRAMAMINE PO  Take 1 tablet by mouth daily as needed (dizzyness).     fludrocortisone 0.1 MG tablet  Commonly known as:  FLORINEF  Take 0.1 mg by mouth daily.     polyethylene glycol packet  Commonly known as:  MIRALAX / GLYCOLAX  Take 17 g by mouth daily as needed for mild constipation.     pravastatin 20 MG tablet  Commonly  known as:  PRAVACHOL  Take 20 mg by mouth daily.     traMADol 50 MG tablet  Commonly known as:  ULTRAM  Take 1 tablet (50 mg total) by mouth every 6 (six) hours as needed for moderate pain or severe pain.        Immunizations: Immunization History  Administered Date(s) Administered  . PPD Test 08/10/2015     Physical Exam: Filed Vitals:   08/11/15 1421  BP: 130/58  Pulse: 62  Temp: 97.8 F (36.6 C)  TempSrc: Oral  Resp: 18  Height: '5\' 10"'$  (1.778 m)  Weight: 157 lb 12.8 oz (71.578 kg)  SpO2: 93%   Body mass index is 22.64 kg/(m^2).  General- elderly male, well built, in no acute distress Head- normocephalic, atraumatic Nose- no maxillary or frontal sinus tenderness, no nasal discharge Throat- moist mucus membrane  Eyes- PERRLA, EOMI, no pallor, no icterus, no discharge, normal conjunctiva, normal sclera Neck- no cervical lymphadenopathy Cardiovascular- normal s1,s2, no murmur, trace leg edema Respiratory- bilateral clear to auscultation, no wheeze, no rhonchi, no crackles, no use of accessory muscles Abdomen- bowel sounds present, soft, non tender Musculoskeletal- able to move all 4 extremities, limited left leg range of motion Neurological- alert and oriented to place and time, resting tremor present Skin- warm and dry, easy bruising, surgical incision to left hip with dressing in place Psychiatry- normal mood and affect    Labs reviewed: Basic Metabolic Panel:  Recent Labs  08/08/15 1713 08/09/15 0651 08/10/15 08/10/15 0424  NA 145 142 144 144  K 3.5 3.9  --  4.3  CL 109 107  --  112*  CO2 26 27  --  27  GLUCOSE 93 229*  --  130*  BUN 29* 29* 34* 34*  CREATININE 1.44* 1.28* 1.2 1.24  CALCIUM 8.6* 8.1*  --  8.1*    CBC:  Recent Labs  08/08/15 1713 08/09/15 0651 08/10/15 08/10/15 0424  WBC 8.5 9.8 11.6 11.6*  NEUTROABS 6.6  --   --   --   HGB 11.9* 11.3*  --  9.6*  HCT 36.4* 33.7*  --  26.5*  MCV 93.3 86.6  --  85.5  PLT 142* 123*  --  110*    Cardiac Enzymes: No results for input(s): CKTOTAL, CKMB, CKMBINDEX, TROPONINI in the last 8760 hours. BNP: Invalid input(s): POCBNP CBG:  Recent Labs  08/09/15 1800 08/10/15 0724 08/10/15 1156  GLUCAP 148* 105* 105*    Radiological Exams: Dg Pelvis Portable  08/08/2015  CLINICAL DATA:  Postop anterior approach hemiarthroplasty EXAM: PORTABLE PELVIS 1-2 VIEWS COMPARISON:  Left hip radiographs dated 08/08/2015 at 1620 hours FINDINGS: Left hip hemiarthroplasty in satisfactory position. Associated subcutaneous gas. No fracture or dislocation is seen. Visualized bony pelvis appears intact. Mild degenerative changes of the right hip. IMPRESSION: Left hip  hemiarthroplasty in satisfactory position. Electronically Signed   By: Julian Hy M.D.   On: 08/08/2015 23:54   Dg Chest Port 1 View  08/08/2015  CLINICAL DATA:  Known left hip fracture, recent fall EXAM: PORTABLE CHEST 1 VIEW COMPARISON:  11/19/2014 FINDINGS: Cardiac shadow is within normal limits. Aortic calcifications are noted. Right hilar mass lesion is noted consistent with the patient's known lung carcinoma. No new focal mass lesion is seen. No infiltrate is noted. Degenerative changes of thoracic spine are seen. IMPRESSION: Changes consistent with the known history of lung carcinoma. No acute abnormality is noted. Electronically Signed   By: Inez Catalina M.D.   On: 08/08/2015 17:30   Dg C-arm 1-60 Min-no Report  08/08/2015  CLINICAL DATA: perioperative use of fluoro for hip fracture repair C-ARM 1-60 MINUTES Fluoroscopy was utilized by the requesting physician.  No radiographic interpretation.   Dg Hip Operative Unilat W Or W/o Pelvis Left  08/08/2015  CLINICAL DATA:  New left hip arthroplasty EXAM: OPERATIVE LEFT HIP (WITH PELVIS IF PERFORMED)  VIEWS TECHNIQUE: Fluoroscopic spot image(s) were submitted for interpretation post-operatively. FLUOROSCOPY TIME:  21.8 seconds COMPARISON:  Left hip radiographs dated 08/08/2015  FINDINGS: Intraoperative fluoroscopic images during left hip hemiarthroplasty. Surgical hardware in anatomic position. IMPRESSION: Intraoperative fluoroscopic images during left hip hemiarthroplasty. Electronically Signed   By: Julian Hy M.D.   On: 08/08/2015 23:22   Dg Hip Unilat With Pelvis 2-3 Views Left  08/08/2015  CLINICAL DATA:  Fall today with left hip pain EXAM: DG HIP (WITH OR WITHOUT PELVIS) 2-3V LEFT COMPARISON:  None. FINDINGS: Nondisplaced comminuted impacted left mid femoral neck fracture with mild apex lateral angulation. No additional fracture. No hip dislocation. Mild-to-moderate osteoarthritis in the weight-bearing portions of both hip joints. Marked degenerative changes in the visualized lower lumbar spine. IMPRESSION: Nondisplaced comminuted impacted left mid femoral neck fracture. Electronically Signed   By: Ilona Sorrel M.D.   On: 08/08/2015 16:37    Assessment/Plan  Physical deconditioning Will have patient work with PT/OT as tolerated to regain strength and restore function.  Fall precautions are in place.  Left femoral neck fracture S/p left hip hemiarthroplasty. Will have him work with physical therapy and occupational therapy team to help with gait training and muscle strengthening exercises.fall precautions. Skin care. Encourage to be out of bed. Continue tramadol 50 mg q6h prn pain with aspirin 325 mg daily x 4 weeks for DVT prophylaxis. Orthopedic follow up  Leukocytosis On chronic fludrocortisone which could contribute some. Monitor cbc  Blood loss anemia Post op, monitor cbc  Thrombocytopenia No signs of bleed reported. Has bruising to his skin. Monitor platelet count  afib Rate controlled. Continue amiodarone 200 mg daily. Off anticoagulation with his fall risk  Constipation Continue docusate 100 mg bid and miralax daily as needed  HLD Continue pravastatin  Chronic orthostatic hypotension Continue fludrocortisone for now, monitor bp. 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 Monitor clinically.    Goals of care: short term rehabilitation   Labs/tests ordered: cbc, cmp 08/13/15  Family/ staff Communication: reviewed care plan with patient and nursing supervisor    Blanchie Serve, MD Internal Medicine [place], [place] [address] Cell Phone (Monday-Friday 8 am - 5 pm): [phone] On Call: [phone] and follow prompts after 5 pm and on weekends Office  Phone: [phone] Office Fax: [phone]

## 2015-08-11 NOTE — Anesthesia Postprocedure Evaluation (Signed)
Anesthesia Post Note  Patient: Ronald Ochoa  Procedure(s) Performed: Procedure(s) (LRB): ANTERIOR APPROACH HEMI HIP ARTHROPLASTY (Left)  Patient location during evaluation: PACU Anesthesia Type: General Level of consciousness: awake and alert Pain management: pain level controlled Vital Signs Assessment: post-procedure vital signs reviewed and stable Respiratory status: spontaneous breathing, nonlabored ventilation, respiratory function stable and patient connected to nasal cannula oxygen Cardiovascular status: blood pressure returned to baseline and stable Postop Assessment: no signs of nausea or vomiting Anesthetic complications: no              Zenaida Deed

## 2015-08-12 LAB — HEPATIC FUNCTION PANEL
ALT: 11 U/L (ref 10–40)
AST: 35 U/L (ref 14–40)
Alkaline Phosphatase: 61 U/L (ref 25–125)
Bilirubin, Total: 0.8 mg/dL

## 2015-08-12 LAB — CBC AND DIFFERENTIAL
HEMATOCRIT: 27 % — AB (ref 41–53)
Hemoglobin: 8.4 g/dL — AB (ref 13.5–17.5)
Neutrophils Absolute: 5 /uL
Platelets: 133 10*3/uL — AB (ref 150–399)
WBC: 7.4 10*3/mL

## 2015-08-12 LAB — BASIC METABOLIC PANEL
BUN: 36 mg/dL — AB (ref 4–21)
CREATININE: 1.1 mg/dL (ref 0.6–1.3)
GLUCOSE: 94 mg/dL
POTASSIUM: 4 mmol/L (ref 3.4–5.3)
Sodium: 144 mmol/L (ref 137–147)

## 2015-08-20 LAB — CBC AND DIFFERENTIAL
HEMATOCRIT: 28 % — AB (ref 41–53)
HEMOGLOBIN: 8.3 g/dL — AB (ref 13.5–17.5)
NEUTROS ABS: 6 /uL
Platelets: 240 10*3/uL (ref 150–399)
WBC: 7.7 10^3/mL

## 2015-08-27 ENCOUNTER — Encounter: Payer: Self-pay | Admitting: Adult Health

## 2015-08-27 NOTE — Progress Notes (Signed)
Patient ID: Ronald Ochoa, male   DOB: [birth year], 82 y.o.   MRN: [id number]

## 2015-08-31 ENCOUNTER — Emergency Department (HOSPITAL_COMMUNITY): Payer: PPO

## 2015-08-31 ENCOUNTER — Encounter (HOSPITAL_COMMUNITY): Payer: Self-pay

## 2015-08-31 ENCOUNTER — Inpatient Hospital Stay (HOSPITAL_COMMUNITY)
Admission: EM | Admit: 2015-08-31 | Discharge: 2015-09-04 | DRG: 189 | Disposition: A | Discharge status: Hospice | Payer: PPO | Attending: Family Medicine | Admitting: Family Medicine

## 2015-08-31 DIAGNOSIS — Z8249 Family history of ischemic heart disease and other diseases of the circulatory system: Secondary | ICD-10-CM

## 2015-08-31 DIAGNOSIS — C7971 Secondary malignant neoplasm of right adrenal gland: Secondary | ICD-10-CM | POA: Diagnosis present

## 2015-08-31 DIAGNOSIS — I951 Orthostatic hypotension: Secondary | ICD-10-CM | POA: Diagnosis present

## 2015-08-31 DIAGNOSIS — Z79899 Other long term (current) drug therapy: Secondary | ICD-10-CM | POA: Diagnosis not present

## 2015-08-31 DIAGNOSIS — Z96642 Presence of left artificial hip joint: Secondary | ICD-10-CM | POA: Diagnosis present

## 2015-08-31 DIAGNOSIS — J9601 Acute respiratory failure with hypoxia: Principal | ICD-10-CM | POA: Diagnosis present

## 2015-08-31 DIAGNOSIS — Z800 Family history of malignant neoplasm of digestive organs: Secondary | ICD-10-CM | POA: Diagnosis not present

## 2015-08-31 DIAGNOSIS — E7800 Pure hypercholesterolemia, unspecified: Secondary | ICD-10-CM | POA: Diagnosis present

## 2015-08-31 DIAGNOSIS — Z808 Family history of malignant neoplasm of other organs or systems: Secondary | ICD-10-CM | POA: Diagnosis not present

## 2015-08-31 DIAGNOSIS — R0902 Hypoxemia: Secondary | ICD-10-CM | POA: Insufficient documentation

## 2015-08-31 DIAGNOSIS — C3490 Malignant neoplasm of unspecified part of unspecified bronchus or lung: Secondary | ICD-10-CM | POA: Diagnosis not present

## 2015-08-31 DIAGNOSIS — Z515 Encounter for palliative care: Secondary | ICD-10-CM | POA: Diagnosis present

## 2015-08-31 DIAGNOSIS — Z9181 History of falling: Secondary | ICD-10-CM | POA: Diagnosis not present

## 2015-08-31 DIAGNOSIS — Z66 Do not resuscitate: Secondary | ICD-10-CM | POA: Diagnosis present

## 2015-08-31 DIAGNOSIS — C3492 Malignant neoplasm of unspecified part of left bronchus or lung: Secondary | ICD-10-CM | POA: Diagnosis present

## 2015-08-31 DIAGNOSIS — I480 Paroxysmal atrial fibrillation: Secondary | ICD-10-CM | POA: Diagnosis present

## 2015-08-31 DIAGNOSIS — R918 Other nonspecific abnormal finding of lung field: Secondary | ICD-10-CM | POA: Diagnosis present

## 2015-08-31 DIAGNOSIS — Z87891 Personal history of nicotine dependence: Secondary | ICD-10-CM | POA: Diagnosis not present

## 2015-08-31 DIAGNOSIS — E876 Hypokalemia: Secondary | ICD-10-CM | POA: Diagnosis present

## 2015-08-31 DIAGNOSIS — Z96649 Presence of unspecified artificial hip joint: Secondary | ICD-10-CM

## 2015-08-31 DIAGNOSIS — C3491 Malignant neoplasm of unspecified part of right bronchus or lung: Secondary | ICD-10-CM | POA: Diagnosis present

## 2015-08-31 DIAGNOSIS — I5032 Chronic diastolic (congestive) heart failure: Secondary | ICD-10-CM | POA: Diagnosis present

## 2015-08-31 DIAGNOSIS — Z9660 Presence of unspecified orthopedic joint implant: Secondary | ICD-10-CM | POA: Diagnosis not present

## 2015-08-31 DIAGNOSIS — Z7189 Other specified counseling: Secondary | ICD-10-CM | POA: Insufficient documentation

## 2015-08-31 DIAGNOSIS — E785 Hyperlipidemia, unspecified: Secondary | ICD-10-CM | POA: Diagnosis present

## 2015-08-31 DIAGNOSIS — N183 Chronic kidney disease, stage 3 unspecified: Secondary | ICD-10-CM | POA: Diagnosis present

## 2015-08-31 DIAGNOSIS — E870 Hyperosmolality and hypernatremia: Secondary | ICD-10-CM | POA: Diagnosis present

## 2015-08-31 DIAGNOSIS — C7951 Secondary malignant neoplasm of bone: Secondary | ICD-10-CM | POA: Diagnosis present

## 2015-08-31 DIAGNOSIS — J90 Pleural effusion, not elsewhere classified: Secondary | ICD-10-CM

## 2015-08-31 DIAGNOSIS — R0600 Dyspnea, unspecified: Secondary | ICD-10-CM | POA: Diagnosis not present

## 2015-08-31 LAB — I-STAT ARTERIAL BLOOD GAS, ED
ACID-BASE DEFICIT: 2 mmol/L (ref 0.0–2.0)
BICARBONATE: 22.5 meq/L (ref 20.0–24.0)
O2 SAT: 93 %
PCO2 ART: 34.5 mmHg — AB (ref 35.0–45.0)
PH ART: 7.422 (ref 7.350–7.450)
PO2 ART: 66 mmHg — AB (ref 80.0–100.0)
Patient temperature: 98.6
TCO2: 24 mmol/L (ref 0–100)

## 2015-08-31 LAB — COMPREHENSIVE METABOLIC PANEL
ALBUMIN: 2.7 g/dL — AB (ref 3.5–5.0)
ALT: 14 U/L — AB (ref 17–63)
AST: 22 U/L (ref 15–41)
Alkaline Phosphatase: 97 U/L (ref 38–126)
Anion gap: 12 (ref 5–15)
BUN: 29 mg/dL — ABNORMAL HIGH (ref 6–20)
CHLORIDE: 110 mmol/L (ref 101–111)
CO2: 24 mmol/L (ref 22–32)
CREATININE: 1.37 mg/dL — AB (ref 0.61–1.24)
Calcium: 8 mg/dL — ABNORMAL LOW (ref 8.9–10.3)
GFR calc non Af Amer: 43 mL/min — ABNORMAL LOW (ref >60)
GFR, EST AFRICAN AMERICAN: 50 mL/min — AB (ref >60)
Glucose, Bld: 95 mg/dL (ref 65–99)
Potassium: 3.2 mmol/L — ABNORMAL LOW (ref 3.5–5.1)
SODIUM: 146 mmol/L — AB (ref 135–145)
Total Bilirubin: 2 mg/dL — ABNORMAL HIGH (ref 0.3–1.2)
Total Protein: 5.3 g/dL — ABNORMAL LOW (ref 6.5–8.1)

## 2015-08-31 LAB — CBC WITH DIFFERENTIAL/PLATELET
Basophils Absolute: 0 10*3/uL (ref 0.0–0.1)
Basophils Relative: 0 %
EOS ABS: 0 10*3/uL (ref 0.0–0.7)
Eosinophils Relative: 0 %
HEMATOCRIT: 33.3 % — AB (ref 39.0–52.0)
HEMOGLOBIN: 10.2 g/dL — AB (ref 13.0–17.0)
LYMPHS ABS: 0.7 10*3/uL (ref 0.7–4.0)
Lymphocytes Relative: 6 %
MCH: 29.3 pg (ref 26.0–34.0)
MCHC: 30.6 g/dL (ref 30.0–36.0)
MCV: 95.7 fL (ref 78.0–100.0)
MONOS PCT: 12 %
Monocytes Absolute: 1.4 10*3/uL — ABNORMAL HIGH (ref 0.1–1.0)
NEUTROS ABS: 9.6 10*3/uL — AB (ref 1.7–7.7)
Neutrophils Relative %: 82 %
Platelets: 217 10*3/uL (ref 150–400)
RBC: 3.48 MIL/uL — ABNORMAL LOW (ref 4.22–5.81)
RDW: 17.1 % — ABNORMAL HIGH (ref 11.5–15.5)
WBC: 11.7 10*3/uL — ABNORMAL HIGH (ref 4.0–10.5)

## 2015-08-31 LAB — I-STAT TROPONIN, ED: Troponin i, poc: 0.03 ng/mL (ref 0.00–0.08)

## 2015-08-31 LAB — BRAIN NATRIURETIC PEPTIDE: B Natriuretic Peptide: 434.7 pg/mL — ABNORMAL HIGH (ref 0.0–100.0)

## 2015-08-31 LAB — TROPONIN I: Troponin I: <0.03 ng/mL (ref <0.031)

## 2015-08-31 LAB — PROCALCITONIN: Procalcitonin: 0.14 ng/mL

## 2015-08-31 MED ORDER — VANCOMYCIN HCL IN DEXTROSE 1-5 GM/200ML-% IV SOLN
1000.0000 mg | INTRAVENOUS | Status: DC
  Administered 2015-08-31 – 2015-09-02 (×3): 1000 mg via INTRAVENOUS
  Filled 2015-08-31 (×4): qty 200

## 2015-08-31 MED ORDER — SODIUM CHLORIDE 0.9% FLUSH
3.0000 mL | Freq: Two times a day (BID) | INTRAVENOUS | Status: DC
  Administered 2015-08-31 – 2015-09-03 (×4): 3 mL via INTRAVENOUS

## 2015-08-31 MED ORDER — ENOXAPARIN SODIUM 40 MG/0.4ML ~~LOC~~ SOLN
40.0000 mg | SUBCUTANEOUS | Status: DC
  Administered 2015-08-31 – 2015-09-03 (×4): 40 mg via SUBCUTANEOUS
  Filled 2015-08-31 (×4): qty 0.4

## 2015-08-31 MED ORDER — ALBUTEROL SULFATE (2.5 MG/3ML) 0.083% IN NEBU
5.0000 mg | INHALATION_SOLUTION | Freq: Once | RESPIRATORY_TRACT | Status: AC
  Administered 2015-08-31: 5 mg via RESPIRATORY_TRACT
  Filled 2015-08-31: qty 6

## 2015-08-31 MED ORDER — FUROSEMIDE 10 MG/ML IJ SOLN
40.0000 mg | Freq: Once | INTRAMUSCULAR | Status: AC
  Administered 2015-08-31: 40 mg via INTRAVENOUS
  Filled 2015-08-31: qty 4

## 2015-08-31 MED ORDER — ATORVASTATIN CALCIUM 10 MG PO TABS
10.0000 mg | ORAL_TABLET | Freq: Every day | ORAL | Status: DC
  Administered 2015-09-01 – 2015-09-04 (×4): 10 mg via ORAL
  Filled 2015-08-31 (×5): qty 1

## 2015-08-31 MED ORDER — FERROUS SULFATE 325 (65 FE) MG PO TABS
325.0000 mg | ORAL_TABLET | Freq: Two times a day (BID) | ORAL | Status: DC
  Administered 2015-09-01 – 2015-09-04 (×6): 325 mg via ORAL
  Filled 2015-08-31 (×7): qty 1

## 2015-08-31 MED ORDER — FLUDROCORTISONE ACETATE 0.1 MG PO TABS
0.1000 mg | ORAL_TABLET | Freq: Every day | ORAL | Status: DC
  Administered 2015-09-01: 0.1 mg via ORAL
  Filled 2015-08-31 (×2): qty 1

## 2015-08-31 MED ORDER — SODIUM CHLORIDE 0.9 % IV SOLN: INTRAVENOUS | Status: DC

## 2015-08-31 MED ORDER — TRAMADOL HCL 50 MG PO TABS
25.0000 mg | ORAL_TABLET | Freq: Four times a day (QID) | ORAL | Status: DC | PRN
  Administered 2015-09-02: 25 mg via ORAL
  Filled 2015-08-31: qty 1

## 2015-08-31 MED ORDER — ONDANSETRON HCL 4 MG/2ML IJ SOLN
4.0000 mg | Freq: Four times a day (QID) | INTRAMUSCULAR | Status: DC | PRN
Start: 2015-08-31 — End: 2015-09-04

## 2015-08-31 MED ORDER — ACETAMINOPHEN 325 MG PO TABS
650.0000 mg | ORAL_TABLET | Freq: Four times a day (QID) | ORAL | Status: DC | PRN
  Administered 2015-09-02 – 2015-09-03 (×2): 650 mg via ORAL
  Filled 2015-08-31 (×2): qty 2

## 2015-08-31 MED ORDER — DOCUSATE SODIUM 100 MG PO CAPS
100.0000 mg | ORAL_CAPSULE | Freq: Two times a day (BID) | ORAL | Status: DC
  Administered 2015-08-31 – 2015-09-04 (×8): 100 mg via ORAL
  Filled 2015-08-31 (×8): qty 1

## 2015-08-31 MED ORDER — ACETAMINOPHEN 650 MG RE SUPP: 650.0000 mg | Freq: Four times a day (QID) | RECTAL | Status: DC | PRN

## 2015-08-31 MED ORDER — ASPIRIN EC 325 MG PO TBEC
325.0000 mg | DELAYED_RELEASE_TABLET | Freq: Every day | ORAL | Status: DC
  Administered 2015-09-01 – 2015-09-04 (×4): 325 mg via ORAL
  Filled 2015-08-31 (×4): qty 1

## 2015-08-31 MED ORDER — AMIODARONE HCL 200 MG PO TABS
200.0000 mg | ORAL_TABLET | Freq: Every day | ORAL | Status: DC
  Administered 2015-09-01 – 2015-09-04 (×4): 200 mg via ORAL
  Filled 2015-08-31 (×5): qty 1

## 2015-08-31 MED ORDER — ONDANSETRON HCL 4 MG PO TABS: 4.0000 mg | ORAL_TABLET | Freq: Four times a day (QID) | ORAL | Status: DC | PRN

## 2015-08-31 MED ORDER — PIPERACILLIN-TAZOBACTAM 3.375 G IVPB
3.3750 g | Freq: Three times a day (TID) | INTRAVENOUS | Status: DC
  Administered 2015-08-31 – 2015-09-03 (×7): 3.375 g via INTRAVENOUS
  Filled 2015-08-31 (×10): qty 50

## 2015-08-31 NOTE — ED Provider Notes (Signed)
CSN: [id number]     Arrival date & time 08/31/15  1701 History   First MD Initiated Contact with Patient 08/31/15 1704     Chief Complaint  Patient presents with  . Respiratory Distress      HPI Pt arrives EMS with c/o found in floor of ALF room where he was [facility]. EMS states 84l% on RA prior to treatment. NRB increases to 100% but rales noted to nipple line. CPAP for approx 20 minutes by EMS and Rales improved. Pt arrives on [place] at 3 l. Pt indicates SHOB since yesterday Past Medical History  Diagnosis Date  . Atrial fibrillation ([place])   . Hypercholesterolemia   . Chronic renal failure, stage 3 (moderate)   . Multiple lung nodules on CT    Past Surgical History  Procedure Laterality Date  . Appendectomy  1943  . Anterior approach hemi hip arthroplasty Left 08/08/2015    Procedure: ANTERIOR APPROACH HEMI HIP ARTHROPLASTY;  Surgeon: [name], MD;  Location: [facility]S;  Service: Orthopedics;  Laterality: Left;   Family History  Problem Relation Age of Onset  . CAD Father   . Cancer Mother   . Colon cancer Sister   . Liver cancer Daughter   . Hepatitis C Daughter   . Melanoma Son    Social History  Substance Use Topics  . Smoking status: Former Smoker -- 0.50 packs/day for 15 years    Types: Cigarettes    Quit date: 04/18/1953  . Smokeless tobacco: None  . Alcohol Use: No    Review of Systems  All other systems reviewed and are negative.     Allergies  Review of patient's allergies indicates no known allergies.  Home Medications   Prior to Admission medications   Medication Sig Start Date End Date Taking? Authorizing Provider  acetaminophen (TYLENOL) 500 MG tablet Take 500 mg by mouth every 6 (six) hours as needed.    Historical Provider, MD  amiodarone (PACERONE) 200 MG tablet Take 1 tablet (200 mg total) by mouth daily. 12/02/14   Jerline Pain, MD  aspirin EC 325 MG EC tablet Take 1 tablet (325 mg total) by mouth daily with breakfast. To be taken for 30 days  postop, then discontinue. 08/10/15   Modena Jansky, MD  atorvastatin (LIPITOR) 10 MG tablet Take 10 mg by mouth daily. HLD    Historical Provider, MD  DimenhyDRINATE (DRAMAMINE PO) Take 1 tablet by mouth daily as needed (dizzyness).    Historical Provider, MD  docusate sodium (COLACE) 100 MG capsule Take 1 capsule (100 mg total) by mouth 2 (two) times daily. 08/10/15   Modena Jansky, MD  ferrous sulfate 325 (65 FE) MG tablet Take 325 mg by mouth 2 (two) times daily with a meal.    Historical Provider, MD  fludrocortisone (FLORINEF) 0.1 MG tablet Take 0.1 mg by mouth daily.    Historical Provider, MD  polyethylene glycol (MIRALAX / GLYCOLAX) packet Take 17 g by mouth daily as needed for mild constipation. 08/10/15   Modena Jansky, MD  traMADol (ULTRAM) 50 MG tablet Take 25 mg by mouth every 6 (six) hours as needed. Take 1/2 tablet to = 12.5 mg scheduled    Historical Provider, MD   BP 122/56 mmHg  Pulse 70  Temp(Src) 100.2 F (37.9 C) (Rectal)  Resp 23  Ht '5\' 9"'$  (1.753 m)  Wt 150 lb (68.04 kg)  BMI 22.14 kg/m2  SpO2 100% Physical Exam  Constitutional: He is oriented  to person, place, and time. He appears well-developed and well-nourished. No distress.  HENT:  Head: Normocephalic and atraumatic.  Eyes: Pupils are equal, round, and reactive to light.  Neck: Normal range of motion.  Cardiovascular: Normal rate and intact distal pulses.   Pulmonary/Chest: No respiratory distress. He has rales.  Abdominal: Normal appearance. He exhibits no distension. There is no tenderness. There is no rebound.  Musculoskeletal: Normal range of motion.  Neurological: He is alert and oriented to person, place, and time. No cranial nerve deficit.  Skin: Skin is warm and dry. No rash noted.  Psychiatric: He has a normal mood and affect. His behavior is normal.  Nursing note and vitals reviewed.   ED Course  Procedures (including critical care time) Labs Review Labs Reviewed  COMPREHENSIVE  METABOLIC PANEL - Abnormal; Notable for the following:    Sodium 146 (*)    Potassium 3.2 (*)    BUN 29 (*)    Creatinine, Ser 1.37 (*)    Calcium 8.0 (*)    Total Protein 5.3 (*)    Albumin 2.7 (*)    ALT 14 (*)    Total Bilirubin 2.0 (*)    GFR calc non Af Amer 43 (*)    GFR calc Af Amer 50 (*)    All other components within normal limits  CBC WITH DIFFERENTIAL/PLATELET - Abnormal; Notable for the following:    WBC 11.7 (*)    RBC 3.48 (*)    Hemoglobin 10.2 (*)    HCT 33.3 (*)    RDW 17.1 (*)    Neutro Abs 9.6 (*)    Monocytes Absolute 1.4 (*)    All other components within normal limits  BRAIN NATRIURETIC PEPTIDE - Abnormal; Notable for the following:    B Natriuretic Peptide 434.7 (*)    All other components within normal limits  BLOOD GAS, ARTERIAL  I-STAT TROPOININ, ED    Imaging Review Dg Chest 2 View  08/31/2015  CLINICAL DATA:  Shortness of breath. Found down on floor at assisted living facility. Atrial fibrillation. EXAM: CHEST  2 VIEW COMPARISON:  Multiple exams, including 08/08/2015 FINDINGS: 6.7 by 7.7 right mid lung mass, similar to 4/20 2/17, shown previously to be hypermetabolic compatible with malignancy. Thoracic spondylosis. Atherosclerotic aortic arch. Borderline enlargement of the cardiopericardial silhouette. Small bilateral pleural effusions are new. Indistinct left upper lobe pulmonary nodule. IMPRESSION: 1. Small new bilateral pleural effusions. 2. Stable right hilar mass, about 7.8 cm in diameter, compatible with malignancy. Indistinct left upper lobe pulmonary nodule. Electronically Signed   By: Van Clines M.D.   On: 08/31/2015 17:55   I have personally reviewed and evaluated these images and lab results as part of my medical decision-making.   EKG Interpretation   Date/Time:  Monday Aug 31 2015 17:58:08 EDT Ventricular Rate:  69 PR Interval:  207 QRS Duration: 97 QT Interval:  383 QTC Calculation: 410 R Axis:   117 Text Interpretation:   Sinus rhythm Atrial premature complex Probable right  ventricular hypertrophy Inferior infarct, age indeterminate Anterolateral  infarct, age indeterminate Confirmed by Tynetta Bachmann  MD, Saphia Vanderford ([id number]) on  08/31/2015 6:12:08 PM      MDM   Final diagnoses:  Hypoxia  Malignant neoplasm of lung, unspecified laterality, unspecified part of lung (HCC)        Leonard Schwartz, MD 08/31/15 2006

## 2015-08-31 NOTE — ED Notes (Signed)
Pt arrives EMS with c/o found in floor of ALF room where he was [facility]. EMS states 84l% on RA prior to treatment. NRB increases to 100% but rales noted to nipple line. CPAP for approx 20 minutes by EMS and Rales improved. Pt arrives on [place] at 3 l. Pt indicates SHOB since yesterday

## 2015-08-31 NOTE — ED Notes (Signed)
Pt noted as having increased cough and some increased coarse sounds at left lung. Same reported Dr. Audie Pinto.

## 2015-08-31 NOTE — Progress Notes (Signed)
ANTIBIOTIC CONSULT NOTE - INITIAL  Pharmacy Consult for Vancomycin and Zosyn Indication: HCAP  No Known Allergies  Patient Measurements: Height: '5\' 9"'$  (175.3 cm) Weight: 150 lb (68.04 kg) IBW/kg (Calculated) : 70.7  Vital Signs: Temp: 98.7 F (37.1 C) (05/15 2110) Temp Source: Oral (05/15 2110) BP: 130/46 mmHg (05/15 2110) Pulse Rate: 77 (05/15 2110)  Labs:  Recent Labs  08/31/15 1734  WBC 11.7*  HGB 10.2*  PLT 217  CREATININE 1.37*   Estimated Creatinine Clearance: 33.1 mL/min (by C-G formula based on Cr of 1.37).  Microbiology:  08/09/15 MRSA screen negative  No cultures this admission  Medical History: Past Medical History  Diagnosis Date  . Atrial fibrillation ([place])   . Hypercholesterolemia   . Chronic renal failure, stage 3 (moderate)   . Multiple lung nodules on CT    Assessment:  82 yr old male admitted from ALF to begin [place] for HCAP coverage.  Tmax 100.2, WBC 11.7. No cultures.  Goal of Therapy:  Vancomycin trough level 15-20 mcg/ml appropriate Zosyn dose for renal function and infection  Plan:   Vancomycin 1 gm IV q24hrs  Zosyn 3.375 gm IV q8hrs (each over 4 hrs)  Will follow renal function, progress, antibiotic plans; cultures if done.  Vanc trough level at steady-state if > 5 days of Vanc expected.  Arty Baumgartner, [place] Pager: [phone] 08/31/2015,10:35 PM

## 2015-08-31 NOTE — H&P (Signed)
History and Physical    Ronald Ochoa RDE:[id number] DOB: 09-01-1920 DOA: 08/31/2015  PCP: [name], MD  Patient coming from: Assisted living facility.  History obtained from patient's son ER physician and previous records.  Chief Complaint: Shortness of breath.  HPI: Ronald Ochoa is a 82 y.o. male with medical history significant of memory issues, recent left hemi-arthroplasty of the hip, paroxysmal atrial fibrillation not on anticoagulant secondary to GI bleed, hyperlipidemia chronic kidney disease was brought from the assisted living facility after patient was found to be short of breath. As per patient's son patient was in the skilled nursing for study until Friday, 08/28/2015 and was transferred to assisted living facility. At that time patient's son states he was doing fine but later on assisted living facility called patient's son stated he was getting short of breath and was transferred to ER. In the ER patient was initially placed on C because patient was hypoxic and chest x-ray was showing congestion with pleural effusion. Patient is also having productive cough. Patient has been admitted for acute respiratory failure probably possible pneumonia versus CHF. Patient is a poor historian but denies any chest pain.  ED Course: Antibiotics were started empirically.  Review of Systems: As per HPI otherwise 10 point review of systems negative.    Past Medical History  Diagnosis Date  . Atrial fibrillation ([place])   . Hypercholesterolemia   . Chronic renal failure, stage 3 (moderate)   . Multiple lung nodules on CT     Past Surgical History  Procedure Laterality Date  . Appendectomy  1943  . Anterior approach hemi hip arthroplasty Left 08/08/2015    Procedure: ANTERIOR APPROACH HEMI HIP ARTHROPLASTY;  Surgeon: [name], MD;  Location: [facility];  Service: Orthopedics;  Laterality: Left;     reports that he quit smoking about 62 years ago. His smoking use  included Cigarettes. He has a 7.5 pack-year smoking history. He does not have any smokeless tobacco history on file. He reports that he does not drink alcohol or use illicit drugs.  No Known Allergies  Family History  Problem Relation Age of Onset  . CAD Father   . Cancer Mother   . Colon cancer Sister   . Liver cancer Daughter   . Hepatitis C Daughter   . Melanoma Son     Prior to Admission medications   Medication Sig Start Date End Date Taking? Authorizing Provider  acetaminophen (TYLENOL) 500 MG tablet Take 500 mg by mouth every 6 (six) hours as needed.   Yes Historical Provider, MD  amiodarone (PACERONE) 200 MG tablet Take 1 tablet (200 mg total) by mouth daily. 12/02/14  Yes Jerline Pain, MD  aspirin EC 325 MG EC tablet Take 1 tablet (325 mg total) by mouth daily with breakfast. To be taken for 30 days postop, then discontinue. 08/10/15  Yes Modena Jansky, MD  atorvastatin (LIPITOR) 10 MG tablet Take 10 mg by mouth daily. HLD   Yes Historical Provider, MD  DimenhyDRINATE (DRAMAMINE PO) Take 1 tablet by mouth daily as needed (dizzyness).   Yes Historical Provider, MD  docusate sodium (COLACE) 100 MG capsule Take 1 capsule (100 mg total) by mouth 2 (two) times daily. 08/10/15  Yes Modena Jansky, MD  ferrous sulfate 325 (65 FE) MG tablet Take 325 mg by mouth 2 (two) times daily with a meal.   Yes Historical Provider, MD  fludrocortisone (FLORINEF) 0.1 MG tablet Take 0.1 mg by mouth daily.  Yes Historical Provider, MD  polyethylene glycol (MIRALAX / GLYCOLAX) packet Take 17 g by mouth daily as needed for mild constipation. 08/10/15  Yes Modena Jansky, MD  traMADol (ULTRAM) 50 MG tablet Take 25 mg by mouth every 6 (six) hours as needed. Take 1/2 tablet to = 12.5 mg scheduled   Yes Historical Provider, MD    Physical Exam: Filed Vitals:   08/31/15 1930 08/31/15 1952 08/31/15 2000 08/31/15 2030  BP: 120/46  122/56 129/87  Pulse: 68  70 74  Temp:      TempSrc:      Resp: '24   23 26  '$ Height:      Weight:      SpO2: 100% 99% 100% 98%      Constitutional: Not in distress. Filed Vitals:   08/31/15 1930 08/31/15 1952 08/31/15 2000 08/31/15 2030  BP: 120/46  122/56 129/87  Pulse: 68  70 74  Temp:      TempSrc:      Resp: '24  23 26  '$ Height:      Weight:      SpO2: 100% 99% 100% 98%   Eyes: Anicteric no pallor. ENMT: No discharge from the ears eyes nose and mouth. Neck: JVD mildly elevated no mass felt. Respiratory: No rhonchi or crepitations. Cardiovascular: S1-S2 heard. Abdomen: Soft nontender bowel sounds present. Musculoskeletal: Left hip has sutures. Skin: No rash. Neurologic: Alert awake oriented to his name follows commands moving all extremities. Psychiatric: Has mild memory issues.   Labs on Admission: I have personally reviewed following labs and imaging studies  CBC:  Recent Labs Lab 08/31/15 1734  WBC 11.7*  NEUTROABS 9.6*  HGB 10.2*  HCT 33.3*  MCV 95.7  PLT 127   Basic Metabolic Panel:  Recent Labs Lab 08/31/15 1734  NA 146*  K 3.2*  CL 110  CO2 24  GLUCOSE 95  BUN 29*  CREATININE 1.37*  CALCIUM 8.0*   GFR: Estimated Creatinine Clearance: 33.1 mL/min (by C-G formula based on Cr of 1.37). Liver Function Tests:  Recent Labs Lab 08/31/15 1734  AST 22  ALT 14*  ALKPHOS 97  BILITOT 2.0*  PROT 5.3*  ALBUMIN 2.7*   No results for input(s): LIPASE, AMYLASE in the last 168 hours. No results for input(s): AMMONIA in the last 168 hours. Coagulation Profile: No results for input(s): INR, PROTIME in the last 168 hours. Cardiac Enzymes: No results for input(s): CKTOTAL, CKMB, CKMBINDEX, TROPONINI in the last 168 hours. BNP (last 3 results) No results for input(s): PROBNP in the last 8760 hours. HbA1C: No results for input(s): HGBA1C in the last 72 hours. CBG: No results for input(s): GLUCAP in the last 168 hours. Lipid Profile: No results for input(s): CHOL, HDL, LDLCALC, TRIG, CHOLHDL, LDLDIRECT in the last  72 hours. Thyroid Function Tests: No results for input(s): TSH, T4TOTAL, FREET4, T3FREE, THYROIDAB in the last 72 hours. Anemia Panel: No results for input(s): VITAMINB12, FOLATE, FERRITIN, TIBC, IRON, RETICCTPCT in the last 72 hours. Urine analysis: No results found for: COLORURINE, APPEARANCEUR, LABSPEC, PHURINE, GLUCOSEU, HGBUR, BILIRUBINUR, KETONESUR, PROTEINUR, UROBILINOGEN, NITRITE, LEUKOCYTESUR Sepsis Labs: '@LABRCNTIP'$ (procalcitonin:4,lacticidven:4) )No results found for this or any previous visit (from the past 240 hour(s)).   Radiological Exams on Admission: Dg Chest 2 View  08/31/2015  CLINICAL DATA:  Shortness of breath. Found down on floor at assisted living facility. Atrial fibrillation. EXAM: CHEST  2 VIEW COMPARISON:  Multiple exams, including 08/08/2015 FINDINGS: 6.7 by 7.7 right mid lung mass, similar to 4/20  2/17, shown previously to be hypermetabolic compatible with malignancy. Thoracic spondylosis. Atherosclerotic aortic arch. Borderline enlargement of the cardiopericardial silhouette. Small bilateral pleural effusions are new. Indistinct left upper lobe pulmonary nodule. IMPRESSION: 1. Small new bilateral pleural effusions. 2. Stable right hilar mass, about 7.8 cm in diameter, compatible with malignancy. Indistinct left upper lobe pulmonary nodule. Electronically Signed   By: Van Clines M.D.   On: 08/31/2015 17:55    EKG: Independently reviewed. Normal sinus rhythm with APCs and RVH.  Assessment/Plan Principal Problem:   Acute respiratory failure with hypoxia (HCC) Active Problems:   Paroxysmal atrial fibrillation (HCC)   CKD (chronic kidney disease) stage 3, GFR 30-59 ml/min   S/P hip hemiarthroplasty   Malignant neoplasm of lung ([place])    #1. Acute respiratory failure with hypoxia - suspect multifactorial. Could be from pneumonia given the productive cough for which I have placed patient on vancomycin and Zosyn. Patient also has bilateral pleural effusion and  I have given 1 dose of Lasix 40 mg IV and did to further assess to see if patient will need further doses. I have ordered a VQ scan to rule out any PE. Patient was initially on CPAP and presently on 3 L oxygen. Closely follow intake output metabolic panel. Check troponins. #2. Paroxysmal atrial fibrillation - chads 2 vasc score is more than 2 but not on anticoagulation due to risk for bleeding. Patient is on amiodarone in sinus rhythm. #3. Chronic kidney disease stage III - creatinine appears to be at baseline. #4. History of lung cancer stage IV patient opted not for treatment. #5. Recent blood loss anemia requiring transfusion after hip surgery - follow CBC. #6. Recent left hemiarthroplasty of the hip - no acute issues at this time. #7. Memory issues.   DVT prophylaxis: Lovenox. Code Status: DO NOT RESUSCITATE.  Family Communication: Patient's son.  Disposition Plan: Assisted living facility.  Consults called: None.  Admission status: Inpatient. Stepdown unit. Likely stay 2-3 days.    Rise Patience MD [facility] Pager [phone].  If 7PM-7AM, please contact night-coverage [email or web address] Password [facility]  08/31/2015, 9:40 PM

## 2015-09-01 ENCOUNTER — Inpatient Hospital Stay (HOSPITAL_COMMUNITY): Payer: PPO

## 2015-09-01 DIAGNOSIS — R0600 Dyspnea, unspecified: Secondary | ICD-10-CM

## 2015-09-01 LAB — TROPONIN I
TROPONIN I: 0.03 ng/mL (ref <0.031)
TROPONIN I: 0.03 ng/mL (ref <0.031)

## 2015-09-01 LAB — COMPREHENSIVE METABOLIC PANEL
ALK PHOS: 84 U/L (ref 38–126)
ALT: 13 U/L — AB (ref 17–63)
AST: 18 U/L (ref 15–41)
Albumin: 2.5 g/dL — ABNORMAL LOW (ref 3.5–5.0)
Anion gap: 13 (ref 5–15)
BUN: 29 mg/dL — ABNORMAL HIGH (ref 6–20)
CALCIUM: 8 mg/dL — AB (ref 8.9–10.3)
CHLORIDE: 108 mmol/L (ref 101–111)
CO2: 28 mmol/L (ref 22–32)
CREATININE: 1.33 mg/dL — AB (ref 0.61–1.24)
GFR, EST AFRICAN AMERICAN: 52 mL/min — AB (ref >60)
GFR, EST NON AFRICAN AMERICAN: 45 mL/min — AB (ref >60)
GLUCOSE: 129 mg/dL — AB (ref 65–99)
Potassium: 2.6 mmol/L — CL (ref 3.5–5.1)
Sodium: 149 mmol/L — ABNORMAL HIGH (ref 135–145)
Total Bilirubin: 2.2 mg/dL — ABNORMAL HIGH (ref 0.3–1.2)
Total Protein: 5.5 g/dL — ABNORMAL LOW (ref 6.5–8.1)

## 2015-09-01 LAB — CBC WITH DIFFERENTIAL/PLATELET
BASOS PCT: 0 %
Basophils Absolute: 0 10*3/uL (ref 0.0–0.1)
EOS ABS: 0 10*3/uL (ref 0.0–0.7)
Eosinophils Relative: 0 %
HEMATOCRIT: 31.1 % — AB (ref 39.0–52.0)
Hemoglobin: 9.5 g/dL — ABNORMAL LOW (ref 13.0–17.0)
Lymphocytes Relative: 6 %
Lymphs Abs: 0.6 10*3/uL — ABNORMAL LOW (ref 0.7–4.0)
MCH: 29.2 pg (ref 26.0–34.0)
MCHC: 30.5 g/dL (ref 30.0–36.0)
MCV: 95.7 fL (ref 78.0–100.0)
MONO ABS: 1.4 10*3/uL — AB (ref 0.1–1.0)
MONOS PCT: 14 %
Neutro Abs: 8.1 10*3/uL — ABNORMAL HIGH (ref 1.7–7.7)
Neutrophils Relative %: 80 %
Platelets: 205 10*3/uL (ref 150–400)
RBC: 3.25 MIL/uL — ABNORMAL LOW (ref 4.22–5.81)
RDW: 17.1 % — AB (ref 11.5–15.5)
WBC: 10.2 10*3/uL (ref 4.0–10.5)

## 2015-09-01 LAB — ECHOCARDIOGRAM COMPLETE
Height: 69 in
WEIGHTICAEL: 2204.6 [oz_av]

## 2015-09-01 LAB — MRSA PCR SCREENING: MRSA BY PCR: NEGATIVE

## 2015-09-01 MED ORDER — POTASSIUM CHLORIDE 10 MEQ/100ML IV SOLN
10.0000 meq | INTRAVENOUS | Status: AC
  Administered 2015-09-01 (×4): 10 meq via INTRAVENOUS
  Filled 2015-09-01 (×5): qty 100

## 2015-09-01 MED ORDER — TECHNETIUM TO 99M ALBUMIN AGGREGATED
4.0000 | Freq: Once | INTRAVENOUS | Status: AC | PRN
  Administered 2015-09-01: 4 via INTRAVENOUS

## 2015-09-01 MED ORDER — POTASSIUM CHLORIDE CRYS ER 20 MEQ PO TBCR
40.0000 meq | EXTENDED_RELEASE_TABLET | Freq: Once | ORAL | Status: AC
  Administered 2015-09-01: 40 meq via ORAL
  Filled 2015-09-01: qty 2

## 2015-09-01 MED ORDER — CETYLPYRIDINIUM CHLORIDE 0.05 % MT LIQD
7.0000 mL | Freq: Two times a day (BID) | OROMUCOSAL | Status: DC
Start: 2015-09-01 — End: 2015-09-04
  Administered 2015-09-02 – 2015-09-03 (×4): 7 mL via OROMUCOSAL

## 2015-09-01 MED ORDER — CHLORHEXIDINE GLUCONATE 0.12 % MT SOLN
15.0000 mL | Freq: Two times a day (BID) | OROMUCOSAL | Status: DC
  Administered 2015-09-01 – 2015-09-04 (×5): 15 mL via OROMUCOSAL
  Filled 2015-09-01 (×6): qty 15

## 2015-09-01 MED ORDER — DEXTROSE IN LACTATED RINGERS 5 % IV SOLN
INTRAVENOUS | Status: DC
  Administered 2015-09-01: 18:00:00 via INTRAVENOUS

## 2015-09-01 MED ORDER — TECHNETIUM TC 99M DIETHYLENETRIAME-PENTAACETIC ACID: 31.0000 | Freq: Once | INTRAVENOUS | Status: DC | PRN

## 2015-09-01 NOTE — Care Management Note (Addendum)
Case Management Note  Patient Details  Name: Ronald Ochoa MRN: [id number] Date of Birth: Jul 20, 1920  Subjective/Objective:    Patient is from [facility] per CSW who confirmed with son.  Patient presents with Acute Resp failure .  NCM will cont to follow for dc needs.               Action/Plan:   Expected Discharge Date:                  Expected Discharge Plan:  Assisted Living / Rest Home  In-House Referral:  Clinical Social Work  Discharge planning Services  CM Consult  Post Acute Care Choice:    Choice offered to:     DME Arranged:    DME Agency:     HH Arranged:    HH Agency:     Status of Service:  In process, will continue to follow  Medicare Important Message Given:    Date Medicare IM Given:    Medicare IM give by:    Date Additional Medicare IM Given:    Additional Medicare Important Message give by:     If discussed at [place] of Stay Meetings, dates discussed:    Additional Comments:  Zenon Mayo, RN 09/01/2015, 12:29 PM

## 2015-09-01 NOTE — Progress Notes (Signed)
  Echocardiogram 2D Echocardiogram has been performed.  Diamond Nickel 09/01/2015, 3:47 PM

## 2015-09-01 NOTE — Progress Notes (Signed)
PROGRESS NOTE    Ronald Ochoa  LOV:[id number] DOB: [birth year] DOA: 08/31/2015 PCP: [name], MD  Outpatient Specialists:     Brief Narrative:  45 ? A. fib-not on anticoagulation 2/2 prior AGIB-CHad2Vasc2 score=2-3, On Amiodarone hypercholesterolemia,  stage III chronic kidney disease,  orthostatic hypotension on fludrocortisone,  Metastatic non small cell lung cancer [stg IV] + bilateral pulmonary metastasis-opted not for Rx-Opted due to poor memory not to undergo pallaitve XRT aswell right adrenal met the stasis and lytic lesions to L2 (as per PET scan 11/19/14) not treated  Recent admit 4/22-->4/24 Hip # s/p repair  Found on floor of [place] facility, sats 84 percentile with rales and placed on 3L Initially placed on BiPAP Secondary to fusion-also empirically started vancomycin and Zosyn as MAXIMUM TEMPERATURE 100.2 with chest x-ray changes, WBC 10.2   Assessment & Plan:   Principal Problem:   Acute respiratory failure with hypoxia (HCC) Active Problems:   Paroxysmal atrial fibrillation (HCC)   CKD (chronic kidney disease) stage 3, GFR 30-59 ml/min   S/P hip hemiarthroplasty   Malignant neoplasm of lung (HCC)   Acute hypoxic respiratory failure -Pneumonia vs effusion, malignant? -Continue antibiotics, reassess chest x-ray a.m.2 view + CBC -Okay with BiPAP temporarily if needed but no mechanical ventilation that is invasive nor chest compressions -Will need workup dependent on further input from son and clinical status but probably need Goals of care moving forward with family and Son who I spoke with 5/16  Atrial fibrillation [place] score 2 on amiodarone, not on anticoagulants secondary to GI bleed -Continue amiodarone-probably not optimal choice given renal insufficiency/risk for pulmonary toxicity -Monitor in a.m.  Stage III CKD Severe hypokalemia 2.6 Hypernatremia sodium 149 -replace potassium with 4 x IV runs + replace orally Kdur  40 -Avoid nephrotoxins -Start free water D5 50 cc per hour-expectation to correct free water deficit over 24-36 hours  Orthostatic hypotension on Florinef -Given not hypotensive would hold stress dose steroids and continue maintenance therapy  Recent hip fracture and repair or 08/09/2015 -Reevaluate wound in the a.m -Overall morbidity/mortality elevated secondary to hip fracture in addition to other multiple comorbidities.       DVT prophylaxis: Lovenox Code Status: Full Family Communication: (d/w son peter on phone 5/16 Disposition Plan: keep SDU today  Consultants:   none  Procedures:   none  Antimicrobials:   zosyn    Subjective:   alert awake Cannot tell me the county but can oriented to hospital, place and date Till feeling a little weak and short of breath Not recall incident leading up to admission  Objective: Filed Vitals:   08/31/15 2344 09/01/15 0435 09/01/15 0500 09/01/15 0700  BP:  115/45    Pulse:  69  68  Temp: 98.3 F (36.8 C) 98.5 F (36.9 C)    TempSrc: Oral Oral    Resp:  26  19  Height:      Weight:   62.5 kg (137 lb 12.6 oz)   SpO2:  91%  94%    Intake/Output Summary (Last 24 hours) at 09/01/15 0753 Last data filed at 09/01/15 0503  Gross per 24 hour  Intake    460 ml  Output    950 ml  Net   -490 ml   Filed Weights   08/31/15 1707 09/01/15 0500  Weight: 68.04 kg (150 lb) 62.5 kg (137 lb 12.6 oz)    Examination:  General exam: Appears calm and comfortable  Respiratory system: Clear to auscultation. Respiratory effort  normal. Cardiovascular system: S1 & S2 heard, RRR. No JVD, murmurs, rubs, gallops or clicks. No pedal edema. Gastrointestinal system: Abdomen is nondistended, soft and nontender. No organomegaly or masses felt. Normal bowel sounds heard. Central nervous system: Alert and oriented. No focal neurological deficits. Extremities: Symmetric 5 x 5 power. Skin: No rashes, lesions or ulcers Psychiatry: Judgement and  insight appear normal. Mood & affect appropriate.     Data Reviewed: I have personally reviewed following labs and imaging studies  CBC:  Recent Labs Lab 08/31/15 1734 09/01/15 0338  WBC 11.7* 10.2  NEUTROABS 9.6* 8.1*  HGB 10.2* 9.5*  HCT 33.3* 31.1*  MCV 95.7 95.7  PLT 217 629   Basic Metabolic Panel:  Recent Labs Lab 08/31/15 1734 09/01/15 0338  NA 146* 149*  K 3.2* 2.6*  CL 110 108  CO2 24 28  GLUCOSE 95 129*  BUN 29* 29*  CREATININE 1.37* 1.33*  CALCIUM 8.0* 8.0*   GFR: Estimated Creatinine Clearance: 31.3 mL/min (by C-G formula based on Cr of 1.33). Liver Function Tests:  Recent Labs Lab 08/31/15 1734 09/01/15 0338  AST 22 18  ALT 14* 13*  ALKPHOS 97 84  BILITOT 2.0* 2.2*  PROT 5.3* 5.5*  ALBUMIN 2.7* 2.5*   No results for input(s): LIPASE, AMYLASE in the last 168 hours. No results for input(s): AMMONIA in the last 168 hours. Coagulation Profile: No results for input(s): INR, PROTIME in the last 168 hours. Cardiac Enzymes:  Recent Labs Lab 08/31/15 2152 09/01/15 0338  TROPONINI <0.03 0.03   BNP (last 3 results) No results for input(s): PROBNP in the last 8760 hours. HbA1C: No results for input(s): HGBA1C in the last 72 hours. CBG: No results for input(s): GLUCAP in the last 168 hours. Lipid Profile: No results for input(s): CHOL, HDL, LDLCALC, TRIG, CHOLHDL, LDLDIRECT in the last 72 hours. Thyroid Function Tests: No results for input(s): TSH, T4TOTAL, FREET4, T3FREE, THYROIDAB in the last 72 hours. Anemia Panel: No results for input(s): VITAMINB12, FOLATE, FERRITIN, TIBC, IRON, RETICCTPCT in the last 72 hours. Urine analysis: No results found for: COLORURINE, APPEARANCEUR, LABSPEC, PHURINE, GLUCOSEU, HGBUR, BILIRUBINUR, KETONESUR, PROTEINUR, UROBILINOGEN, NITRITE, LEUKOCYTESUR Sepsis Labs: @LABRCNTIP (procalcitonin:4,lacticidven:4)  ) Recent Results (from the past 240 hour(s))  MRSA PCR Screening     Status: None   Collection Time:  09/01/15  1:00 AM  Result Value Ref Range Status   MRSA by PCR NEGATIVE NEGATIVE Final    Comment:        The GeneXpert MRSA Assay (FDA approved for NASAL specimens only), is one component of a comprehensive MRSA colonization surveillance program. It is not intended to diagnose MRSA infection nor to guide or monitor treatment for MRSA infections.          Radiology Studies: Dg Chest 2 View  08/31/2015  CLINICAL DATA:  Shortness of breath. Found down on floor at assisted living facility. Atrial fibrillation. EXAM: CHEST  2 VIEW COMPARISON:  Multiple exams, including 08/08/2015 FINDINGS: 6.7 by 7.7 right mid lung mass, similar to 4/20 2/17, shown previously to be hypermetabolic compatible with malignancy. Thoracic spondylosis. Atherosclerotic aortic arch. Borderline enlargement of the cardiopericardial silhouette. Small bilateral pleural effusions are new. Indistinct left upper lobe pulmonary nodule. IMPRESSION: 1. Small new bilateral pleural effusions. 2. Stable right hilar mass, about 7.8 cm in diameter, compatible with malignancy. Indistinct left upper lobe pulmonary nodule. Electronically Signed   By: Van Clines M.D.   On: 08/31/2015 17:55        Scheduled Meds: .  amiodarone  200 mg Oral Daily  . aspirin EC  325 mg Oral Q breakfast  . atorvastatin  10 mg Oral Daily  . docusate sodium  100 mg Oral BID  . enoxaparin (LOVENOX) injection  40 mg Subcutaneous Q24H  . ferrous sulfate  325 mg Oral BID WC  . fludrocortisone  0.1 mg Oral Daily  . piperacillin-tazobactam (ZOSYN)  IV  3.375 g Intravenous Q8H  . potassium chloride  10 mEq Intravenous Q1 Hr x 4  . sodium chloride flush  3 mL Intravenous Q12H  . vancomycin  1,000 mg Intravenous Q24H   Continuous Infusions:    LOS: 1 day    Time spent: [place], MD [facility] ([phone]   If 7PM-7AM, please contact night-coverage [email or web address] Password [facility] 09/01/2015, 7:53 AM

## 2015-09-01 NOTE — Progress Notes (Signed)
Attempted to give patient scheduled medications and pt said, "Damn, leave me alone." When he was told what medications would be given he said "No!" Pt stated, "No I just want to be left alone."

## 2015-09-01 NOTE — Clinical Social Work Note (Signed)
Clinical Social Work Assessment  Patient Details  Name: Ronald Ochoa MRN: [id number] Date of Birth: 08-04-1920  Date of referral:  09/01/15               Reason for consult:  Facility Placement, Discharge Planning                Permission sought to share information with:  Facility Sport and exercise psychologist, Family Supports Permission granted to share information::  Yes, Verbal Permission Granted  Name::     Ronald Ochoa  Agency::  [facility]  Relationship::  Son  Contact Information:  [phone]  Housing/Transportation Living arrangements for the past 2 months:  [place], [place] of Information:  Medical Team, Adult Children Patient Interpreter Needed:  None Criminal Activity/Legal Involvement Pertinent to Current Situation/Hospitalization:  No - Comment as needed Significant Relationships:  Adult Children Lives with:  Facility Resident Do you feel safe going back to the place where you live?  Yes Need for family participation in patient care:  Yes (Comment)  Care giving concerns:  Patient will return to [facility] at [place].   Social Worker assessment / plan:  CSW called patient's son. Patient not fully oriented. Per social work notes from last admission, patient was transported to [facility]. Patient's son said that patient left that facility on Friday and went back to [facility] [place]. Patient's son confirmed that he will return there at discharge. No set discharge date yet. Patient's son asked CSW if CSW would facilitate transportation. CSW confirmed. Patient's son stated that [name] has their own transport using [employer]. CSW will confirm with facility. No further concerns. CSW encouraged patient's son to contact CSW as needed. CSW will continue to follow patient and his family for support and facilitate discharge back to ALF once medically stable for discharge.  Employment  status:  Retired Forensic scientist:  Other (Comment Required) ([employer]) PT Recommendations:  Not assessed at this time Information / Referral to community resources:  Other (Comment Required) (None. Patient will return to [facility].)  Patient/Family's Response to care:  Patient not fully oriented. Patient's son agreeable to return to ALF. Patient's son supportive and involved in patient's care. Patient's son polite and appreciated social work intervention.  Patient/Family's Understanding of and Emotional Response to Diagnosis, Current Treatment, and Prognosis:  Patent's son knowledgeable of medical interventions and aware of discharge back to ALF once medically stable.  Emotional Assessment Appearance:  Appears stated age Attitude/Demeanor/Rapport:  Unable to Assess Affect (typically observed):  Unable to Assess Orientation:  Oriented to Self, Oriented to  Time Alcohol / Substance use:  Never Used Psych involvement (Current and /or in the community):  No (Comment)  Discharge Needs  Concerns to be addressed:  Care Coordination Readmission within the last 30 days:  Yes Current discharge risk:  Dependent with Mobility Barriers to Discharge:  No Barriers Identified   Ronald Chroman, LCSW 09/01/2015, 12:31 PM

## 2015-09-01 NOTE — NC FL2 (Signed)
[place] LEVEL OF CARE SCREENING TOOL     IDENTIFICATION  Patient Name: Ronald Ochoa Birthdate: 21-Oct-1920 Sex: male Admission Date (Current Location): 08/31/2015  [facility] and [place] Number:  Herbalist and Address:  [facility] [place], [place] [address] [place],  [address]      Provider Number: [id number]  Attending Physician Name and Address:  Nita Sells, MD  Relative Name and Phone Number:       Current Level of Care: Hospital Recommended Level of Care: [place] Prior Approval Number:    Date Approved/Denied:   PASRR Number:    Discharge Plan: Other (Comment) (ALF)    Current Diagnoses: Patient Active Problem List   Diagnosis Date Noted  . Hypoxia 08/31/2015  . Acute respiratory failure with hypoxia ([place]) 08/31/2015  . Malignant neoplasm of lung ([place]) 08/31/2015  . Fracture of femoral neck, left ([place]) 08/08/2015  . Lung cancer ([place]) 08/08/2015  . Anemia 08/08/2015  . Thrombocytopenia ([place]) 08/08/2015  . CKD (chronic kidney disease) stage 3, GFR 30-59 ml/min 08/08/2015  . Femoral neck fracture ([place]) 08/08/2015  . S/P hip hemiarthroplasty 08/08/2015  . Paroxysmal atrial fibrillation ([place]) 12/02/2014  . On amiodarone therapy 12/02/2014  . Orthostatic hypotension 12/02/2014  . Heart murmur 12/02/2014  . Multiple lung nodules on CT 11/10/2014  . Right lower lobe lung mass 11/10/2014    Orientation RESPIRATION BLADDER Height & Weight     Self, Time  O2 (Nasal Canula 2 L) Incontinent Weight: 137 lb 12.6 oz (62.5 kg) Height:  '5\' 9"'$  (175.3 cm)  BEHAVIORAL SYMPTOMS/MOOD NEUROLOGICAL BOWEL NUTRITION STATUS   (None)  (None) Incontinent Diet (DYS 1)  AMBULATORY STATUS COMMUNICATION OF NEEDS Skin   Extensive Assist Verbally PU Stage and Appropriate Care, Surgical wounds (PU on mid-lower sacrum. Closed incision on left hip.) PU Stage 1 Dressing:  (Foam prn)                     Personal  Care Assistance Level of Assistance  Bathing, Feeding, Dressing Bathing Assistance: Limited assistance Feeding assistance: Limited assistance Dressing Assistance: Limited assistance     Functional Limitations Info  Sight, Hearing, Speech Sight Info: Adequate Hearing Info: Adequate Speech Info: Adequate    SPECIAL CARE FACTORS FREQUENCY                       Contractures Contractures Info: Not present    Additional Factors Info  Code Status, Allergies Code Status Info: DNR Allergies Info: NKDA           Current Medications (09/01/2015):  This is the current hospital active medication list Current Facility-Administered Medications  Medication Dose Route Frequency Provider Last Rate Last Dose  . acetaminophen (TYLENOL) tablet 650 mg  650 mg Oral Q6H PRN Rise Patience, MD       Or  . acetaminophen (TYLENOL) suppository 650 mg  650 mg Rectal Q6H PRN Rise Patience, MD      . amiodarone (PACERONE) tablet 200 mg  200 mg Oral Daily Rise Patience, MD   200 mg at 09/01/15 1047  . aspirin EC tablet 325 mg  325 mg Oral Q breakfast Rise Patience, MD   325 mg at 09/01/15 0853  . atorvastatin (LIPITOR) tablet 10 mg  10 mg Oral Daily Rise Patience, MD   10 mg at 09/01/15 1047  . docusate sodium (COLACE) capsule 100 mg  100 mg  Oral BID Rise Patience, MD   100 mg at 09/01/15 1047  . enoxaparin (LOVENOX) injection 40 mg  40 mg Subcutaneous Q24H Rise Patience, MD   40 mg at 08/31/15 2325  . ferrous sulfate tablet 325 mg  325 mg Oral BID WC Rise Patience, MD   325 mg at 09/01/15 0853  . fludrocortisone (FLORINEF) tablet 0.1 mg  0.1 mg Oral Daily Rise Patience, MD   0.1 mg at 09/01/15 1047  . ondansetron (ZOFRAN) tablet 4 mg  4 mg Oral Q6H PRN Rise Patience, MD       Or  . ondansetron [facility]) injection 4 mg  4 mg Intravenous Q6H PRN Rise Patience, MD      . piperacillin-tazobactam (ZOSYN) IVPB 3.375 g  3.375 g Intravenous  Q8H Skeet Simmer, RPH   3.375 g at 09/01/15 0503  . sodium chloride flush (NS) 0.9 % injection 3 mL  3 mL Intravenous Q12H Rise Patience, MD   3 mL at 09/01/15 1051  . traMADol (ULTRAM) tablet 25 mg  25 mg Oral Q6H PRN Rise Patience, MD      . vancomycin (VANCOCIN) IVPB 1000 mg/200 mL premix  1,000 mg Intravenous Q24H Skeet Simmer, RPH   1,000 mg at 08/31/15 2325     Discharge Medications: Please see discharge summary for a list of discharge medications.  Relevant Imaging Results:  Relevant Lab Results:   Additional Information SS#: [id number]  Candie Chroman, LCSW

## 2015-09-01 NOTE — Progress Notes (Signed)
Critical lab value 2.6 K called to Chaney Malling NP on call.  Orders for K given oral and IV.  Will carry out and continue to monitor.

## 2015-09-02 ENCOUNTER — Inpatient Hospital Stay (HOSPITAL_COMMUNITY): Payer: PPO

## 2015-09-02 DIAGNOSIS — N183 Chronic kidney disease, stage 3 (moderate): Secondary | ICD-10-CM

## 2015-09-02 DIAGNOSIS — I480 Paroxysmal atrial fibrillation: Secondary | ICD-10-CM

## 2015-09-02 DIAGNOSIS — J9601 Acute respiratory failure with hypoxia: Principal | ICD-10-CM

## 2015-09-02 DIAGNOSIS — Z9660 Presence of unspecified orthopedic joint implant: Secondary | ICD-10-CM

## 2015-09-02 LAB — COMPREHENSIVE METABOLIC PANEL
ALT: 11 U/L — ABNORMAL LOW (ref 17–63)
AST: 17 U/L (ref 15–41)
Albumin: 2.1 g/dL — ABNORMAL LOW (ref 3.5–5.0)
Alkaline Phosphatase: 71 U/L (ref 38–126)
Anion gap: 9 (ref 5–15)
BUN: 28 mg/dL — ABNORMAL HIGH (ref 6–20)
CHLORIDE: 110 mmol/L (ref 101–111)
CO2: 28 mmol/L (ref 22–32)
Calcium: 7.8 mg/dL — ABNORMAL LOW (ref 8.9–10.3)
Creatinine, Ser: 1.15 mg/dL (ref 0.61–1.24)
GFR, EST NON AFRICAN AMERICAN: 53 mL/min — AB (ref >60)
Glucose, Bld: 108 mg/dL — ABNORMAL HIGH (ref 65–99)
POTASSIUM: 3.2 mmol/L — AB (ref 3.5–5.1)
Sodium: 147 mmol/L — ABNORMAL HIGH (ref 135–145)
Total Bilirubin: 1.2 mg/dL (ref 0.3–1.2)
Total Protein: 4.6 g/dL — ABNORMAL LOW (ref 6.5–8.1)

## 2015-09-02 LAB — CBC
HEMATOCRIT: 29.1 % — AB (ref 39.0–52.0)
HEMOGLOBIN: 8.9 g/dL — AB (ref 13.0–17.0)
MCH: 28.8 pg (ref 26.0–34.0)
MCHC: 30.6 g/dL (ref 30.0–36.0)
MCV: 94.2 fL (ref 78.0–100.0)
Platelets: 191 10*3/uL (ref 150–400)
RBC: 3.09 MIL/uL — AB (ref 4.22–5.81)
RDW: 17 % — ABNORMAL HIGH (ref 11.5–15.5)
WBC: 7.3 10*3/uL (ref 4.0–10.5)

## 2015-09-02 MED ORDER — GUAIFENESIN ER 600 MG PO TB12
1200.0000 mg | ORAL_TABLET | Freq: Two times a day (BID) | ORAL | Status: DC
  Administered 2015-09-02 – 2015-09-04 (×5): 1200 mg via ORAL
  Filled 2015-09-02 (×5): qty 2

## 2015-09-02 MED ORDER — POTASSIUM CHLORIDE 10 MEQ/100ML IV SOLN
INTRAVENOUS | Status: AC
  Administered 2015-09-02: 10 meq via INTRAVENOUS
  Filled 2015-09-02: qty 100

## 2015-09-02 MED ORDER — OXYCODONE HCL 5 MG PO TABS
5.0000 mg | ORAL_TABLET | Freq: Four times a day (QID) | ORAL | Status: DC | PRN
  Administered 2015-09-02 – 2015-09-03 (×2): 5 mg via ORAL
  Filled 2015-09-02 (×2): qty 1

## 2015-09-02 MED ORDER — WHITE PETROLATUM GEL
Status: AC
  Filled 2015-09-02: qty 1

## 2015-09-02 MED ORDER — IPRATROPIUM-ALBUTEROL 0.5-2.5 (3) MG/3ML IN SOLN: 3.0000 mL | Freq: Four times a day (QID) | RESPIRATORY_TRACT | Status: DC | PRN

## 2015-09-02 MED ORDER — FUROSEMIDE 10 MG/ML IJ SOLN
20.0000 mg | Freq: Two times a day (BID) | INTRAMUSCULAR | Status: AC
  Administered 2015-09-02 (×2): 20 mg via INTRAVENOUS
  Filled 2015-09-02 (×2): qty 2

## 2015-09-02 MED ORDER — METHYLPREDNISOLONE SODIUM SUCC 40 MG IJ SOLR
40.0000 mg | Freq: Four times a day (QID) | INTRAMUSCULAR | Status: DC
  Administered 2015-09-02 – 2015-09-03 (×4): 40 mg via INTRAVENOUS
  Filled 2015-09-02 (×4): qty 1

## 2015-09-02 MED ORDER — POTASSIUM CHLORIDE 10 MEQ/100ML IV SOLN
10.0000 meq | INTRAVENOUS | Status: AC
  Administered 2015-09-02 (×3): 10 meq via INTRAVENOUS
  Filled 2015-09-02 (×2): qty 100

## 2015-09-02 NOTE — Progress Notes (Signed)
SLP Cancellation Note  Patient Details Name: Ronald Ochoa MRN: [id number] DOB: 1920-11-12   Cancelled treatment:       Reason Eval/Treat Not Completed: Patient declined, no reason specified. SLP offered education and rationale for swallowing test, but pt continues to refuse. Will try at another time.   Germain Osgood, M.A. CCC-SLP [phone]  Germain Osgood 09/02/2015, 10:21 AM

## 2015-09-02 NOTE — Progress Notes (Signed)
[facility]  PROGRESS NOTE  Ronald Ochoa JQZ:[id number] DOB: 03-Apr-1921 DOA: 08/31/2015 PCP: [name], MD  Outpatient specialist:  Brief HPI:  82 yr old male with h/o  A. fib-not on anticoagulation 2/2 prior AGIB-CHad2Vasc2 score=2-3, On Amiodarone hypercholesterolemia,  stage III chronic kidney disease,  orthostatic hypotension on fludrocortisone,  Metastatic non small cell lung cancer [stg IV] + bilateral pulmonary metastasis-opted not for Rx-Opted due to poor memory not to undergo pallaitve XRT aswell right adrenal met the stasis and lytic lesions to L2 (as per PET scan 11/19/14) not treated  Recent admit 4/22-->4/24 Hip # s/p repair  Found on floor of [place] facility, sats 84 percentile with rales and placed on 3L Initially placed on BiPAP Secondary to fusion-also empirically started vancomycin and Zosyn as MAXIMUM TEMPERATURE 100.2 with chest x-ray changes, WBC 10.2   Principal Problem:   Acute respiratory failure with hypoxia (HCC) Active Problems:   Paroxysmal atrial fibrillation (HCC)   CKD (chronic kidney disease) stage 3, GFR 30-59 ml/min   S/P hip hemiarthroplasty   Malignant neoplasm of lung (HCC)   Assessment/Plan: 1. Acute hypoxic respiratory failure- multifactorial, patient has history of metastatic non-small cell lung cancer. Also has history of diastolic heart failure. We'll start Lasix 20 mg IV every 12 hours, Mucinex 1 tablet by mouth twice a day, Solu-Medrol 40 mg every 6 hours, DuoNeb nebulizers every 6 hours when necessary. 2. Paroxysmal atrial fibrillation- Chad2 vasc score is 2. Currently on amiodarone. Patient not on anticoagulants due to GI bleed. 3. Hypokalemia- potassium improved to 3.2 after IV potassium given yesterday. Will give KCl 10 mg IV 3 and follow BMP in a.m. 4. Hypernatremia- we will continue D5 Ringer lactate at 50 mL per hour. 5. Metastatic non-small cell lung cancer- patient refused to undergo  chemotherapy. Will consult palliative care for goals of care.   DVT prophylaxis: Lovenox Code Status: DO NOT RESUSCITATE Family Communication: Discussed with patient's son at bedside Disposition Plan: To be determined, likely ALF  with hospice   Consultants:  None  Procedures:  None  Antibiotics:  Vancomycin  Zosyn  Subjective: Patient seen and examined, breathing better this morning.  Objective: Filed Vitals:   09/02/15 0750 09/02/15 0751 09/02/15 1119 09/02/15 1124  BP: 124/52  146/61   Pulse: 59  64   Temp:  98 F (36.7 C)  98.8 F (37.1 C)  TempSrc:  Oral  Axillary  Resp: 17  25   Height:      Weight:      SpO2: 98%  97%     Intake/Output Summary (Last 24 hours) at 09/02/15 1205 Last data filed at 09/02/15 1125  Gross per 24 hour  Intake 1341.33 ml  Output    550 ml  Net 791.33 ml   Filed Weights   08/31/15 1707 09/01/15 0500  Weight: 68.04 kg (150 lb) 62.5 kg (137 lb 12.6 oz)    Examination:  General exam: Appears calm and comfortable  Respiratory system: Bilateral rhonchi, Respiratory effort normal. Cardiovascular system: S1 & S2 heard, RRR. No JVD, murmurs, rubs, gallops or clicks. No pedal edema. Gastrointestinal system: Abdomen is nondistended, soft and nontender. No organomegaly or masses felt. Normal bowel sounds heard. Central nervous system: Alert and oriented. No focal neurological deficits. Extremities: Symmetric 5 x 5 power. Skin: No rashes, lesions or ulcers Psychiatry: Judgement and insight appear normal. Mood & affect appropriate.    Data Reviewed: I have personally reviewed following labs and imaging studies Basic Metabolic Panel:  Recent Labs Lab 08/31/15 1734 09/01/15 0338 09/02/15 0515  NA 146* 149* 147*  K 3.2* 2.6* 3.2*  CL 110 108 110  CO2 _0 GLUCOSE 95 129* 108*  BUN 29* 29* 28*  CREATININE 1.37* 1.33* 1.15  CALCIUM 8.0* 8.0* 7.8*   Liver Function Tests:  Recent Labs Lab 08/31/15 1734  09/01/15 0338 09/02/15 0515  AST _1 ALT 14* 13* 11*  ALKPHOS 97 84 71  BILITOT 2.0* 2.2* 1.2  PROT 5.3* 5.5* 4.6*  ALBUMIN 2.7* 2.5* 2.1*   No results for input(s): LIPASE, AMYLASE in the last 168 hours. No results for input(s): AMMONIA in the last 168 hours. CBC:  Recent Labs Lab 08/31/15 1734 09/01/15 0338 09/02/15 0515  WBC 11.7* 10.2 7.3  NEUTROABS 9.6* 8.1*  --   HGB 10.2* 9.5* 8.9*  HCT 33.3* 31.1* 29.1*  MCV 95.7 95.7 94.2  PLT 217 205 191   Cardiac Enzymes:  Recent Labs Lab 08/31/15 2152 09/01/15 0338 09/01/15 0758  TROPONINI <0.03 0.03 0.03   BNP (last 3 results)  Recent Labs  08/31/15 1734  BNP 434.7*      Recent Results (from the past 240 hour(s))  MRSA PCR Screening     Status: None   Collection Time: 09/01/15  1:00 AM  Result Value Ref Range Status   MRSA by PCR NEGATIVE NEGATIVE Final    Comment:        The GeneXpert MRSA Assay (FDA approved for NASAL specimens only), is one component of a comprehensive MRSA colonization surveillance program. It is not intended to diagnose MRSA infection nor to guide or monitor treatment for MRSA infections.      Studies: Dg Chest 2 View  09/02/2015  CLINICAL DATA:  Pleural effusion.  Shortness of breath. EXAM: CHEST  2 VIEW COMPARISON:  08/31/2015.  PET-CT 11/19/2014 FINDINGS: Unchanged right/right upper lobe hilar mass. Previously identified be PET positive. No interim change from prior exam. Persistent atelectasis right lower lobe. No pleural effusion or pneumothorax. Stable cardiomegaly. Diffuse osteopenia. No acute bony abnormality. IMPRESSION: Persistent unchanged right hilar/right upper lobe mass. Right lower lobe atelectatic changes. Exam stable from prior exam. Electronically Signed   By: Dewar   On: 09/02/2015 08:17   Dg Chest 2 View  08/31/2015  CLINICAL DATA:  Shortness of breath. Found down on floor at assisted living facility. Atrial fibrillation. EXAM: CHEST  2 VIEW  COMPARISON:  Multiple exams, including 08/08/2015 FINDINGS: 6.7 by 7.7 right mid lung mass, similar to 4/20 2/17, shown previously to be hypermetabolic compatible with malignancy. Thoracic spondylosis. Atherosclerotic aortic arch. Borderline enlargement of the cardiopericardial silhouette. Small bilateral pleural effusions are new. Indistinct left upper lobe pulmonary nodule. IMPRESSION: 1. Small new bilateral pleural effusions. 2. Stable right hilar mass, about 7.8 cm in diameter, compatible with malignancy. Indistinct left upper lobe pulmonary nodule. Electronically Signed   By: Van Clines M.D.   On: 08/31/2015 17:55   Nm Pulmonary Perf And Vent  09/01/2015  CLINICAL DATA:  82 year old male with acute respiratory failure and shortness of breath. Recent left hip arthroplasty. Known metastatic right lung cancer. EXAM: NUCLEAR MEDICINE VENTILATION - PERFUSION LUNG SCAN TECHNIQUE: Ventilation images were obtained in multiple projections using inhaled aerosol Tc-18mDTPA. Perfusion images were obtained in multiple projections after intravenous injection of Tc-985mAA. RADIOPHARMACEUTICALS:  30.0 mCi Technetium-9947mPA aerosol inhalation and 4.0 mCi Technetium-56m40m IV COMPARISON:  None. FINDINGS: Ventilation: Decreased ventilation to the right lung identified, likely  due to known central right lung malignancy. Perfusion: No wedge shaped peripheral perfusion defects to suggest acute pulmonary embolism. Slightly decreased perfusion to the right lung noted. IMPRESSION: Very low probability of pulmonary embolus (less than 10%) Electronically Signed   By: Margarette Canada M.D.   On: 09/01/2015 15:25    Scheduled Meds: . amiodarone  200 mg Oral Daily  . antiseptic oral rinse  7 mL Mouth Rinse q12n4p  . aspirin EC  325 mg Oral Q breakfast  . atorvastatin  10 mg Oral Daily  . chlorhexidine  15 mL Mouth Rinse BID  . docusate sodium  100 mg Oral BID  . enoxaparin (LOVENOX) injection  40 mg Subcutaneous Q24H   . ferrous sulfate  325 mg Oral BID WC  . furosemide  20 mg Intravenous Q12H  . guaiFENesin  1,200 mg Oral BID  . methylPREDNISolone (SOLU-MEDROL) injection  40 mg Intravenous Q6H  . piperacillin-tazobactam (ZOSYN)  IV  3.375 g Intravenous Q8H  . potassium chloride  10 mEq Intravenous Q1 Hr x 3  . sodium chloride flush  3 mL Intravenous Q12H  . vancomycin  1,000 mg Intravenous Q24H  . white petrolatum       Continuous Infusions: . dextrose 5% lactated ringers 10 mL/hr at 09/02/15 1110       Time spent: 25 min    [facility] Pager [phone]. If 7PM-7AM, please contact night-coverage at [email or web address], Office  [phone]  password [email or web address] 09/02/2015, 12:05 PM  LOS: 2 days

## 2015-09-02 NOTE — Progress Notes (Signed)
Palliative care consult request received Chart reviewed Patient seen briefly, he doesn't engage much Son had just left the hospital, visits the patient once in the mornings Call placed and discussed briefly with son Ronald Ochoa.   PLAN: Family meeting scheduled for 09-03-15 at 0830 with the patient's son Ronald Ochoa in the patient's room.  Full note and additional discussions to follow.   Thank you for the consult.  Loistine Chance MD [phone] office [phone] pager [place] palliative medicine team.

## 2015-09-03 DIAGNOSIS — R0902 Hypoxemia: Secondary | ICD-10-CM

## 2015-09-03 DIAGNOSIS — Z7189 Other specified counseling: Secondary | ICD-10-CM | POA: Insufficient documentation

## 2015-09-03 DIAGNOSIS — C3490 Malignant neoplasm of unspecified part of unspecified bronchus or lung: Secondary | ICD-10-CM

## 2015-09-03 LAB — BASIC METABOLIC PANEL
ANION GAP: 12 (ref 5–15)
BUN: 28 mg/dL — ABNORMAL HIGH (ref 6–20)
CO2: 30 mmol/L (ref 22–32)
Calcium: 7.8 mg/dL — ABNORMAL LOW (ref 8.9–10.3)
Chloride: 104 mmol/L (ref 101–111)
Creatinine, Ser: 1.2 mg/dL (ref 0.61–1.24)
GFR calc Af Amer: 59 mL/min — ABNORMAL LOW (ref >60)
GFR, EST NON AFRICAN AMERICAN: 51 mL/min — AB (ref >60)
Glucose, Bld: 188 mg/dL — ABNORMAL HIGH (ref 65–99)
POTASSIUM: 3 mmol/L — AB (ref 3.5–5.1)
SODIUM: 146 mmol/L — AB (ref 135–145)

## 2015-09-03 LAB — MAGNESIUM: MAGNESIUM: 1.9 mg/dL (ref 1.7–2.4)

## 2015-09-03 MED ORDER — METHYLPREDNISOLONE SODIUM SUCC 40 MG IJ SOLR
40.0000 mg | Freq: Two times a day (BID) | INTRAMUSCULAR | Status: DC
  Administered 2015-09-03 – 2015-09-04 (×2): 40 mg via INTRAVENOUS
  Filled 2015-09-03 (×2): qty 1

## 2015-09-03 MED ORDER — POTASSIUM CHLORIDE 10 MEQ/100ML IV SOLN
10.0000 meq | INTRAVENOUS | Status: AC
  Administered 2015-09-03 (×3): 10 meq via INTRAVENOUS
  Filled 2015-09-03 (×3): qty 100

## 2015-09-03 NOTE — Progress Notes (Signed)
SLP Cancellation Note  Patient Details Name: Ronald Ochoa MRN: [id number] DOB: 1920/05/18   Cancelled treatment:       Reason Eval/Treat Not Completed: Patient declined, no reason specified. Pt stated, "I'm done." Will sign off at this time give pt refusal x 2 and plans for hospice. If swallow eval is needed in the future, please reconsult and we would be happy to reattempt.   Dorian Pod, [place] Pager [phone] 09/03/2015, 11:08 AM

## 2015-09-03 NOTE — Progress Notes (Signed)
Pharmacy Antibiotic Note  Ronald Ochoa is a 82 y.o. male admitted on 08/31/2015 with r/o pna.  Pharmacy has been consulted for zosyn and vanc dosing.  Plan: D/C all abx  Height: '5\' 10"'$  (177.8 cm) Weight: 140 lb 8 oz (63.73 kg) IBW/kg (Calculated) : 73  Temp (24hrs), Avg:97.7 F (36.5 C), Min:97.2 F (36.2 C), Max:98.8 F (37.1 C)   Recent Labs Lab 08/31/15 1734 09/01/15 0338 09/02/15 0515 09/03/15 0504  WBC 11.7* 10.2 7.3  --   CREATININE 1.37* 1.33* 1.15 1.20    Estimated Creatinine Clearance: 35.4 mL/min (by C-G formula based on Cr of 1.2).    No Known Allergies  Levester Fresh, PharmD, BCPS, [facility] Clinical Pharmacist Pager [phone] 09/03/2015 12:59 PM

## 2015-09-03 NOTE — Care Management Important Message (Signed)
Important Message  Patient Details  Name: Denilson Salminen MRN: [id number] Date of Birth: 1921-02-20   Medicare Important Message Given:  Yes    Zenon Mayo, RN 09/03/2015, 11:32 AMImportant Message  Patient Details  Name: Herbie Lehrmann MRN: [id number] Date of Birth: 11/12/1920   Medicare Important Message Given:  Yes    Zenon Mayo, RN 09/03/2015, 11:32 AM

## 2015-09-03 NOTE — Care Management Note (Addendum)
Case Management Note  Patient Details  Name: Ronald Ochoa MRN: [id number] Date of Birth: [birth year]  Subjective/Objective:   Patient is from [facility], has palliative meeting this am, plan is to go back to [facility] with Hospice.  NCM contacted son, [name] the [place], he states he would like to go with [facility].  NCM also called [facility] and spoke with Chanelle and she states they would like him to go with [facility] also.  Referral made to [facility] with [facility], informed her that patient will need hospital bed, oxygen and suction dme, NCM gave Marzetta Board the sons phone number as well.  The DME will need to be at the facility before patient is dc.  Marzetta Board will coordinate with the facility, patient will need ambulance transport, will inform CSW.  NCM spoke with Sharee Pimple at [facility] and she states [facility] just delivered the DME the hospital bed, suction and oxygen.  MD  Would like to give patient iv k and then dc in am, NCM informed facility and Stacy with [facility] and patient's son and the CSW.                    Action/Plan:   Expected Discharge Date:                  Expected Discharge Plan:  Assisted Living / Rest Home  In-House Referral:  Clinical Social Work  Discharge planning Services  CM Consult  Post Acute Care Choice:  Long Term Acute Care (LTAC), Hospice Choice offered to:  Adult Children  DME Arranged:    DME Agency:     HH Arranged:    HH Agency:     Status of Service:  Completed, signed off  Medicare Important Message Given:    Date Medicare IM Given:    Medicare IM give by:    Date Additional Medicare IM Given:    Additional Medicare Important Message give by:     If discussed at [place] of Stay Meetings, dates discussed:    Additional Comments:  Zenon Mayo, RN 09/03/2015, 11:04 AM

## 2015-09-03 NOTE — Consult Note (Signed)
Consultation Note Date: 09/03/2015   Patient Name: Ronald Ochoa  DOB: 1921-04-17  MRN: [id number]  Age / Sex: 82 y.o., male  PCP: [name], MD Referring Physician: Oswald Hillock, MD  Reason for Consultation: Establishing goals of care  HPI/Patient Profile: 82 y.o. male  with past medical history of recent hip fracture, history of lung cancer  admitted on 08/31/2015 with multi factorial acute hypoxic respiratory failure.   Clinical Assessment and Goals of Care:   82 yr old male with h/o A. fib-not on anticoagulation 2/2 prior AGIB-CHad2Vasc2 score=2-3, on Amiodarone,hypercholesterolemia, stage III chronic kidney disease, orthostatic hypotension on fludrocortisone, Metastatic non small cell lung cancer [stg IV] + bilateral pulmonary metastasis-opted not for Rx-Opted due to poor memory not to undergo pallaitve XRT as well, right adrenal met the stasis and lytic lesions to L2 (as per PET scan 11/19/14) not treated.  Recent admit 4/22-->4/24 Hip # s/p repair  Did not participate much in rehab at [facility], was hence sent [facility] where he was found on floor, sats 84 percentile with rales and placed on 3L. Admitted with multi factorial acute hypoxic respiratory failure, lung cancer, post obstructive PNA, volume overload. Has been on diuresis as well as Abx.   Palliative consulted for goals of care. Patient is an elderly gentleman who complains of back pain, he is resting in bed, he denies any other complaints. Call placed and discussed with son on 09-02-15 who arrived for family meeting on 09-03-15.   I introduced palliative medicine as follows: Palliative medicine is specialized medical care for people living with serious illness. It focuses on providing relief from the symptoms and stress of a serious illness. The goal is to improve quality of life for both the patient and the family.  Life  review: Patient has worked as an Optometrist, he has lived independently since 06-05-03 when his wife died, he moved from [place] to [place] to be closer to his only surviving child, son Ronald Ochoa last year. Patient had recent hip fracture,s/p repair, went to rehab, then came from [facility] this time.   Discussed with patient's son about goals of care. See recommendations below, thank you for the consult.    Consultants:  HCPOA  son Riaan Toledo is the HCPOA agent Wife is deceased, patient's 2 children are deceased, son Ronald Ochoa is only surviving first degree relative.   SUMMARY OF RECOMMENDATIONS    DNR DNI re confirmed D/C to [facility] with Hospice services when medically appropriate.  Cancel PT/OT services that were being set up through [facility] when patient was D/C recently from [facility] to [facility] Needs DME: hospital bed, O2, suction.   Code Status/Advance Care Planning:  DNR    Symptom Management:    continue current medications.   Palliative Prophylaxis:   Delirium Protocol  Psycho-social/Spiritual:   Desire for further Chaplaincy support:no  Additional Recommendations: Education on Hospice  Prognosis:   < 6 months  Discharge Planning: [facility] with Hospcie services.  Primary Diagnoses: Present on Admission:  . Acute respiratory failure with hypoxia ([place]) . Paroxysmal atrial fibrillation (HCC) . CKD (chronic kidney disease) stage 3, GFR 30-59 ml/min  I have reviewed the medical record, interviewed the patient and family, and examined the patient. The following aspects are pertinent.  Past Medical History  Diagnosis Date  . Atrial fibrillation ([place])   . Hypercholesterolemia   . Chronic renal failure, stage 3 (moderate)   . Multiple lung nodules on CT    Social History   Social History  . Marital Status: Widowed    Spouse Name: N/A  . Number of Children: Y  . Years of Education: N/A   Occupational History  .  retired    Social History Main Topics  . Smoking status: Former Smoker -- 0.50 packs/day for 15 years    Types: Cigarettes    Quit date: 04/18/1953  . Smokeless tobacco: None  . Alcohol Use: No  . Drug Use: No  . Sexual Activity: Not Asked   Other Topics Concern  . None   Social History Narrative   Originally from [place], [place]. He has lived in [place], [place], [place], [place],  & [place]. Has previously traveled to [place], [place], [place], & [place]. 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 He currently lives at [facility]. Previously had dogs. He denies any bird or mold exposure. No hot tub exposure. 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    Family History  Problem Relation Age of Onset  . CAD Father   . Cancer Mother   . Colon cancer Sister   . Liver cancer Daughter   . Hepatitis C Daughter   . Melanoma Son    Scheduled Meds: . amiodarone  200 mg Oral Daily  . antiseptic oral rinse  7 mL Mouth Rinse q12n4p  . aspirin EC  325 mg Oral Q breakfast  . atorvastatin  10 mg Oral Daily  . chlorhexidine  15 mL Mouth Rinse BID  . docusate sodium  100 mg Oral BID  . enoxaparin (LOVENOX) injection  40 mg Subcutaneous Q24H  . ferrous sulfate  325 mg Oral BID WC  . guaiFENesin  1,200 mg Oral BID  . methylPREDNISolone (SOLU-MEDROL) injection  40 mg Intravenous Q12H  . piperacillin-tazobactam (ZOSYN)  IV  3.375 g Intravenous Q8H  . sodium chloride flush  3 mL Intravenous Q12H  . vancomycin  1,000 mg Intravenous Q24H   Continuous Infusions:  PRN Meds:.acetaminophen **OR** acetaminophen, ipratropium-albuterol, ondansetron **OR** ondansetron (ZOFRAN) IV, oxyCODONE, technetium TC 52M diethylenetriame-pentaacetic acid Medications Prior to Admission:  Prior to Admission medications   Medication Sig Start Date End Date Taking? Authorizing Provider  acetaminophen (TYLENOL) 500 MG tablet Take 500 mg by mouth every 6 (six) hours as needed.   Yes Historical  Provider, MD  amiodarone (PACERONE) 200 MG tablet Take 1 tablet (200 mg total) by mouth daily. 12/02/14  Yes Jerline Pain, MD  aspirin EC 325 MG EC tablet Take 1 tablet (325 mg total) by mouth daily with breakfast. To be taken for 30 days postop, then discontinue. 08/10/15  Yes Modena Jansky, MD  atorvastatin (LIPITOR) 10 MG tablet Take 10 mg by mouth daily. HLD   Yes Historical Provider, MD  DimenhyDRINATE (DRAMAMINE PO) Take 1 tablet by mouth daily as needed (dizzyness).   Yes Historical Provider, MD  docusate sodium (COLACE) 100 MG capsule Take 1 capsule (100 mg total) by mouth 2 (two) times daily.  08/10/15  Yes Modena Jansky, MD  ferrous sulfate 325 (65 FE) MG tablet Take 325 mg by mouth 2 (two) times daily with a meal.   Yes Historical Provider, MD  fludrocortisone (FLORINEF) 0.1 MG tablet Take 0.1 mg by mouth daily.   Yes Historical Provider, MD  polyethylene glycol (MIRALAX / GLYCOLAX) packet Take 17 g by mouth daily as needed for mild constipation. 08/10/15  Yes Modena Jansky, MD  traMADol (ULTRAM) 50 MG tablet Take 25 mg by mouth every 6 (six) hours as needed. Take 1/2 tablet to = 12.5 mg scheduled   Yes Historical Provider, MD   No Known Allergies Review of Systems + for back pain, fear of falling.   Physical Exam Weak elderly gentleman NAD S1S2 Clear anteriorly Abdomen soft Senile purpuric spots both UE skin No edema Awake alert, answers questions appropriately, avoids detailed conversations.   Vital Signs: BP 134/59 mmHg  Pulse 58  Temp(Src) 97.6 F (36.4 C) (Oral)  Resp 19  Ht 5' 10" (1.778 m)  Wt 63.73 kg (140 lb 8 oz)  BMI 20.16 kg/m2  SpO2 95% Pain Assessment: Faces   Pain Score: 0-No pain   SpO2: SpO2: 95 % O2 Device:SpO2: 95 % O2 Flow Rate: .O2 Flow Rate (L/min): 2 L/min  IO: Intake/output summary:  Intake/Output Summary (Last 24 hours) at 09/03/15 0915 Last data filed at 09/03/15 0847  Gross per 24 hour  Intake 1988.33 ml  Output   2150 ml    Net -161.67 ml    LBM: Last BM Date: 09/03/15 Baseline Weight: Weight: 68.04 kg (150 lb) Most recent weight: Weight: 63.73 kg (140 lb 8 oz)     Palliative Assessment/Data:   Flowsheet Rows        Most Recent Value   Intake Tab    Referral Department  Hospitalist   Unit at Time of Referral  Intermediate Care Unit   Palliative Care Primary Diagnosis  Cancer   Palliative Care Type  New Palliative care   Reason for referral  Clarify Goals of Care   Date first seen by Palliative Care  09/02/15   Clinical Assessment    Palliative Performance Scale Score  30%   Pain Max last 24 hours  4   Pain Min Last 24 hours  3   Dyspnea Max Last 24 Hours  5   Dyspnea Min Last 24 hours  4   Psychosocial & Spiritual Assessment    Palliative Care Outcomes    Patient/Family meeting held?  Yes   Who was at the meeting?  son Ronald Ochoa.    Palliative Care Outcomes  Clarified goals of care   Palliative Care follow-up planned  Yes, Facility     Time In: 8 Time Out: 9 Time Total: 60 min  Greater than 50%  of this time was spent counseling and coordinating care related to the above assessment and plan.  Signed by: Loistine Chance, MD  [phone]  Please contact Palliative Medicine Team phone at [phone] for questions and concerns.  For individual provider: See Shea Evans

## 2015-09-03 NOTE — Progress Notes (Signed)
[facility]  PROGRESS NOTE  Ronald Ochoa FXJ:[id number] DOB: 1920-08-18 DOA: 08/31/2015 PCP: [name], MD  Outpatient specialist:  Brief HPI:  82 yr old male with h/o  A. fib-not on anticoagulation 2/2 prior AGIB-CHad2Vasc2 score=2-3, On Amiodarone hypercholesterolemia,  stage III chronic kidney disease,  orthostatic hypotension on fludrocortisone,  Metastatic non small cell lung cancer [stg IV] + bilateral pulmonary metastasis-opted not for Rx-Opted due to poor memory not to undergo pallaitve XRT aswell right adrenal met the stasis and lytic lesions to L2 (as per PET scan 11/19/14) not treated  Recent admit 4/22-->4/24 Hip # s/p repair  Found on floor of [facility] facility, sats 84 percentile with rales and placed on 3L Initially placed on BiPAP Secondary to fusion-also empirically started vancomycin and Zosyn as MAXIMUM TEMPERATURE 100.2 with chest x-ray changes, WBC 10.2   Principal Problem:   Acute respiratory failure with hypoxia (HCC) Active Problems:   Paroxysmal atrial fibrillation (HCC)   CKD (chronic kidney disease) stage 3, GFR 30-59 ml/min   S/P hip hemiarthroplasty   Malignant neoplasm of lung (HCC)   Goals of care, counseling/discussion   Assessment/Plan: 1. Acute hypoxic respiratory failure- improved, multifactorial, patient has history of metastatic non-small cell lung cancer. Also has history of diastolic heart failure.  Mucinex 1 tablet by mouth twice a day, will cut down the dose of Solu-Medrol 40 mg every 12 hours. And discharge on tapering dose of prednisone. 2. Paroxysmal atrial fibrillation- Chad2 vasc score is 2. Currently on amiodarone. Patient not on anticoagulants due to GI bleed. 3. Hypokalemia- potassium is 3.0 today. Will give KCl 10 mg IV 3 and follow BMP in a.m. Will check serum magnesium. 4. Hypernatremia- sodium 147 5. Metastatic non-small cell lung cancer- patient refused to undergo chemotherapy. Palliative care  has seen the patient, goals of care done and patient to go to assisted living facility with hospice services.  DVT prophylaxis: Lovenox Code Status: DO NOT RESUSCITATE Family Communication: Discussed with patient's son at bedside Disposition Plan:  ALF  with hospice   Consultants:  None  Procedures:  None  Antibiotics:  Vancomycin  Zosyn  Subjective: Patient seen and examined, denies any complaints. No shortness of breath or chest pain.  Objective: Filed Vitals:   09/03/15 0426 09/03/15 0804 09/03/15 1123 09/03/15 1202  BP: 126/70 134/59 128/48   Pulse: 57 58 62   Temp: 97.4 F (36.3 C) 97.6 F (36.4 C)  97.2 F (36.2 C)  TempSrc: Oral Oral  Oral  Resp: _0 Height: 5' 10" (1.778 m)     Weight: 63.73 kg (140 lb 8 oz)     SpO2: 100% 95% 100%     Intake/Output Summary (Last 24 hours) at 09/03/15 1332 Last data filed at 09/03/15 0847  Gross per 24 hour  Intake 1563.67 ml  Output   2050 ml  Net -486.33 ml   Filed Weights   08/31/15 1707 09/01/15 0500 09/03/15 0426  Weight: 68.04 kg (150 lb) 62.5 kg (137 lb 12.6 oz) 63.73 kg (140 lb 8 oz)    Examination:  General exam: Appears calm and comfortable  Respiratory system: Clear to auscultation bilaterally, Respiratory effort normal. Cardiovascular system: S1 & S2 heard, RRR. No JVD, murmurs, rubs, gallops or clicks. No pedal edema. Gastrointestinal system: Abdomen is nondistended, soft and nontender. No organomegaly or masses felt. Normal bowel sounds heard. Central nervous system: Alert and oriented. No focal neurological deficits. Extremities: Symmetric 5 x 5 power. Skin: No rashes,  lesions or ulcers Psychiatry: Judgement and insight appear normal. Mood & affect appropriate.    Data Reviewed: I have personally reviewed following labs and imaging studies Basic Metabolic Panel:  Recent Labs Lab 08/31/15 1734 09/01/15 0338 09/02/15 0515 09/03/15 0504  NA 146* 149* 147* 146*  K 3.2* 2.6* 3.2* 3.0*   CL 110 108 110 104  CO2 _0 GLUCOSE 95 129* 108* 188*  BUN 29* 29* 28* 28*  CREATININE 1.37* 1.33* 1.15 1.20  CALCIUM 8.0* 8.0* 7.8* 7.8*   Liver Function Tests:  Recent Labs Lab 08/31/15 1734 09/01/15 0338 09/02/15 0515  AST _1 ALT 14* 13* 11*  ALKPHOS 97 84 71  BILITOT 2.0* 2.2* 1.2  PROT 5.3* 5.5* 4.6*  ALBUMIN 2.7* 2.5* 2.1*   No results for input(s): LIPASE, AMYLASE in the last 168 hours. No results for input(s): AMMONIA in the last 168 hours. CBC:  Recent Labs Lab 08/31/15 1734 09/01/15 0338 09/02/15 0515  WBC 11.7* 10.2 7.3  NEUTROABS 9.6* 8.1*  --   HGB 10.2* 9.5* 8.9*  HCT 33.3* 31.1* 29.1*  MCV 95.7 95.7 94.2  PLT 217 205 191   Cardiac Enzymes:  Recent Labs Lab 08/31/15 2152 09/01/15 0338 09/01/15 0758  TROPONINI <0.03 0.03 0.03   BNP (last 3 results)  Recent Labs  08/31/15 1734  BNP 434.7*      Recent Results (from the past 240 hour(s))  MRSA PCR Screening     Status: None   Collection Time: 09/01/15  1:00 AM  Result Value Ref Range Status   MRSA by PCR NEGATIVE NEGATIVE Final    Comment:        The GeneXpert MRSA Assay (FDA approved for NASAL specimens only), is one component of a comprehensive MRSA colonization surveillance program. It is not intended to diagnose MRSA infection nor to guide or monitor treatment for MRSA infections.      Studies: Dg Chest 2 View  09/02/2015  CLINICAL DATA:  Pleural effusion.  Shortness of breath. EXAM: CHEST  2 VIEW COMPARISON:  08/31/2015.  PET-CT 11/19/2014 FINDINGS: Unchanged right/right upper lobe hilar mass. Previously identified be PET positive. No interim change from prior exam. Persistent atelectasis right lower lobe. No pleural effusion or pneumothorax. Stable cardiomegaly. Diffuse osteopenia. No acute bony abnormality. IMPRESSION: Persistent unchanged right hilar/right upper lobe mass. Right lower lobe atelectatic changes. Exam stable from prior exam. Electronically  Signed   By: Marcello Moores  Register   On: 09/02/2015 08:17   Nm Pulmonary Perf And Vent  09/01/2015  CLINICAL DATA:  82 year old male with acute respiratory failure and shortness of breath. Recent left hip arthroplasty. Known metastatic right lung cancer. EXAM: NUCLEAR MEDICINE VENTILATION - PERFUSION LUNG SCAN TECHNIQUE: Ventilation images were obtained in multiple projections using inhaled aerosol Tc-39mDTPA. Perfusion images were obtained in multiple projections after intravenous injection of Tc-959mAA. RADIOPHARMACEUTICALS:  30.0 mCi Technetium-9952mPA aerosol inhalation and 4.0 mCi Technetium-82m75m IV COMPARISON:  None. FINDINGS: Ventilation: Decreased ventilation to the right lung identified, likely due to known central right lung malignancy. Perfusion: No wedge shaped peripheral perfusion defects to suggest acute pulmonary embolism. Slightly decreased perfusion to the right lung noted. IMPRESSION: Very low probability of pulmonary embolus (less than 10%) Electronically Signed   By: JeffMargarette Canada.   On: 09/01/2015 15:25    Scheduled Meds: . amiodarone  200 mg Oral Daily  . antiseptic oral rinse  7 mL Mouth Rinse q12n4p  . aspirin EC  325 mg Oral Q breakfast  . atorvastatin  10 mg Oral Daily  . chlorhexidine  15 mL Mouth Rinse BID  . docusate sodium  100 mg Oral BID  . enoxaparin (LOVENOX) injection  40 mg Subcutaneous Q24H  . ferrous sulfate  325 mg Oral BID WC  . guaiFENesin  1,200 mg Oral BID  . methylPREDNISolone (SOLU-MEDROL) injection  40 mg Intravenous Q12H  . potassium chloride  10 mEq Intravenous Q1 Hr x 3  . sodium chloride flush  3 mL Intravenous Q12H   Continuous Infusions:       Time spent: 25 min    [facility] Pager [phone]. If 7PM-7AM, please contact night-coverage at [email or web address], Office  [phone]  password [email or web address] 09/03/2015, 1:32 PM  LOS: 3 days

## 2015-09-03 NOTE — Progress Notes (Signed)
Notified by Neoma Laming [facility] of family request for Hospice and [place] services at [facility] after discharge. Chart and patient information currently under review to confirm hospice eligibility.  Spoke with [name] via phone conversation to initiate education related to hospice philosophy, services and team approach to care. [name] verbalized understanding of the information provided. Per discussion, plan is for discharge to [facility] via [facility] today.  Please send signed completed DNR form home with patient. Patient will need prescriptions for discharge comfort medications.  DME needs discussed and family requested hospital bed, oxygen and suction for delivery to the facility today. [facility] equipment manager Jewel Ysidro Evert notified and will contact [facility] to arrange delivery to the facility. Shanell, from [facility] plans to call hospital CM when equipment is delivered.  [facility]erral [facility] aware of the above.  Completed discharge summary will need to be faxed to [facility] at [phone] when final. Please notify [facility] when patient is ready to leave unit at discharge-call [phone].   Please call with any questions. Thank Rhae Lerner RN, [facility] Liaison [phone]

## 2015-09-03 NOTE — Progress Notes (Signed)
Nutrition Brief Note  Chart reviewed due to low Braden score.  Patient is being discharged to SNF with Hospice care.  No nutrition interventions warranted at this time.  Please consult as needed.   Molli Barrows, RD, LDN, [place] Pager [phone] After Hours Pager [phone]

## 2015-09-04 LAB — BASIC METABOLIC PANEL
ANION GAP: 11 (ref 5–15)
BUN: 35 mg/dL — ABNORMAL HIGH (ref 6–20)
CHLORIDE: 104 mmol/L (ref 101–111)
CO2: 31 mmol/L (ref 22–32)
Calcium: 8.2 mg/dL — ABNORMAL LOW (ref 8.9–10.3)
Creatinine, Ser: 1.14 mg/dL (ref 0.61–1.24)
GFR calc Af Amer: >60 mL/min (ref >60)
GFR, EST NON AFRICAN AMERICAN: 54 mL/min — AB (ref >60)
GLUCOSE: 141 mg/dL — AB (ref 65–99)
POTASSIUM: 3.7 mmol/L (ref 3.5–5.1)
Sodium: 146 mmol/L — ABNORMAL HIGH (ref 135–145)

## 2015-09-04 LAB — PROCALCITONIN: PROCALCITONIN: 0.1 ng/mL

## 2015-09-04 MED ORDER — IPRATROPIUM-ALBUTEROL 0.5-2.5 (3) MG/3ML IN SOLN: 3.0000 mL | Freq: Four times a day (QID) | RESPIRATORY_TRACT | Status: AC | PRN

## 2015-09-04 MED ORDER — OXYCODONE HCL 5 MG PO TABS: 5.0000 mg | ORAL_TABLET | Freq: Four times a day (QID) | ORAL | Status: AC | PRN

## 2015-09-04 MED ORDER — PREDNISONE 10 MG PO TABS: ORAL_TABLET | ORAL | Status: AC

## 2015-09-04 NOTE — Care Management Note (Signed)
Case Management Note  Patient Details  Name: Olan Kurek MRN: [id number] Date of Birth: 10/11/1920  Subjective/Objective:  Patient for dc to [facility] with [facility].                    Action/Plan:   Expected Discharge Date:                  Expected Discharge Plan:  Assisted Living / Rest Home  In-House Referral:  Clinical Social Work  Discharge planning Services  CM Consult  Post Acute Care Choice:  Long Term Acute Care (LTAC), Hospice Choice offered to:  Adult Children  DME Arranged:    DME Agency:     HH Arranged:    HH Agency:     Status of Service:  Completed, signed off  Medicare Important Message Given:  Yes Date Medicare IM Given:    Medicare IM give by:    Date Additional Medicare IM Given:    Additional Medicare Important Message give by:     If discussed at [place] of Stay Meetings, dates discussed:    Additional Comments:  Zenon Mayo, RN 09/04/2015, 11:51 AM

## 2015-09-04 NOTE — Clinical Social Work Note (Signed)
CSW facilitated patient discharge including contacting patient family and facility to confirm patient discharge plans. Clinical information faxed to facility and family agreeable with plan. CSW arranged ambulance transport via [place] to [place] at [facility] [phone]). RN to call report prior to discharge.  CSW will sign off for now as social work intervention is no longer needed. Please consult [place] again if new needs arise.  Dayton Scrape, [place]

## 2015-09-04 NOTE — Progress Notes (Signed)
Patient discharged via non-emergent ambulance to ALF. Attempted to call report but no one was able to take it at this time. Will continue to attempt report.

## 2015-09-04 NOTE — Discharge Summary (Signed)
Physician Discharge Summary  Ronald Ochoa MBT:[id number] DOB: [birth year] DOA: 08/31/2015  PCP: [name], MD  Admit date: 08/31/2015 Discharge date: 09/04/2015  Time spent: 25* minutes  Recommendations for Outpatient Follow-up:  1. Follow up PCP in 2 weeks   Discharge Diagnoses:  Principal Problem:   Acute respiratory failure with hypoxia (HCC) Active Problems:   Paroxysmal atrial fibrillation (HCC)   CKD (chronic kidney disease) stage 3, GFR 30-59 ml/min   S/P hip hemiarthroplasty   Malignant neoplasm of lung (HCC)   Goals of care, counseling/discussion   Discharge Condition: Stable  Diet recommendation: Regular diet  Filed Weights   09/01/15 0500 09/03/15 0426 09/04/15 0532  Weight: 62.5 kg (137 lb 12.6 oz) 63.73 kg (140 lb 8 oz) 63.095 kg (139 lb 1.6 oz)    History of present illness:  82 yr old male with h/o  A. fib-not on anticoagulation 2/2 prior AGIB-CHad2Vasc2 score=2-3, On Amiodarone hypercholesterolemia,  stage III chronic kidney disease,  orthostatic hypotension on fludrocortisone,  Metastatic non small cell lung cancer [stg IV] + bilateral pulmonary metastasis-opted not for Rx-Opted due to poor memory not to undergo pallaitve XRT aswell right adrenal met the stasis and lytic lesions to L2 (as per PET scan 11/19/14) not treated  Recent admit 4/22-->4/24 Hip # s/p repair  Found on floor of [facility] facility, sats 84 percentile with rales and placed on 3L Initially placed on BiPAP Secondary to fusion-also empirically started vancomycin and Zosyn as MAXIMUM TEMPERATURE 100.2 with chest x-ray changes, WBC 10.2   Hospital Course:  1. Acute hypoxic respiratory failure- improved, multifactorial, patient has history of metastatic non-small cell lung cancer. Also has history of diastolic heart failure. Mucinex 1 tablet by mouth twice a day, was started on Solu-Medrol 40 mg every 12 hours. And discharge on tapering dose of prednisone for five  more days. 2. Paroxysmal atrial fibrillation- Chad2 vasc score is 2. Currently on amiodarone. Patient not on anticoagulants due to GI bleed. 3. Hypokalemia- potassium replaced , today potassium is 3.7.Serum magnesium is 1.9 4. Hypernatremia- improved,  sodium 146 5. Metastatic non-small cell lung cancer- patient refused to undergo chemotherapy. Palliative care has seen the patient, goals of care done and patient to go to assisted living facility with hospice services.    Procedures:  none  Consultations:  Palliative care  Discharge Exam: Filed Vitals:   09/04/15 0532 09/04/15 0751  BP: 142/57 148/58  Pulse: 54 57  Temp: 97.4 F (36.3 C) 97.2 F (36.2 C)  Resp: 15 17    General: Appears in no acute distress Cardiovascular: S1s2 rrr Respiratory: Clear bilaterally  Discharge Instructions   Discharge Instructions    Diet - low sodium heart healthy    Complete by:  As directed      Increase activity slowly    Complete by:  As directed           Current Discharge Medication List    START taking these medications   Details  ipratropium-albuterol (DUONEB) 0.5-2.5 (3) MG/3ML SOLN Take 3 mLs by nebulization every 6 (six) hours as needed. Qty: 360 mL, Refills: 0    oxyCODONE (OXY IR/ROXICODONE) 5 MG immediate release tablet Take 1 tablet (5 mg total) by mouth every 6 (six) hours as needed for severe pain. Qty: 30 tablet, Refills: 0    predniSONE (DELTASONE) 10 MG tablet Prednisone 40 mg po daily x 1 day then Prednisone 30 mg po daily x 1 day then Prednisone 20 mg po daily x  1 day then Prednisone 10 mg daily x 1 day then stop... Qty: 10 tablet, Refills: 0      CONTINUE these medications which have NOT CHANGED   Details  acetaminophen (TYLENOL) 500 MG tablet Take 500 mg by mouth every 6 (six) hours as needed.    amiodarone (PACERONE) 200 MG tablet Take 1 tablet (200 mg total) by mouth daily. Qty: 90 tablet, Refills: 3    aspirin EC 325 MG EC tablet Take 1 tablet  (325 mg total) by mouth daily with breakfast. To be taken for 30 days postop, then discontinue.    atorvastatin (LIPITOR) 10 MG tablet Take 10 mg by mouth daily. HLD    DimenhyDRINATE (DRAMAMINE PO) Take 1 tablet by mouth daily as needed (dizzyness).    docusate sodium (COLACE) 100 MG capsule Take 1 capsule (100 mg total) by mouth 2 (two) times daily.    ferrous sulfate 325 (65 FE) MG tablet Take 325 mg by mouth 2 (two) times daily with a meal.    fludrocortisone (FLORINEF) 0.1 MG tablet Take 0.1 mg by mouth daily.    polyethylene glycol (MIRALAX / GLYCOLAX) packet Take 17 g by mouth daily as needed for mild constipation.      STOP taking these medications     traMADol (ULTRAM) 50 MG tablet        No Known Allergies Follow-up Information    Follow up with [facility].   Specialty:  Hospice and Palliative Medicine   Why:  patient will have hospice at the [facility]   Contact information:   [place] [address] [phone]        The results of significant diagnostics from this hospitalization (including imaging, microbiology, ancillary and laboratory) are listed below for reference.    Significant Diagnostic Studies: Dg Chest 2 View  09/02/2015  CLINICAL DATA:  Pleural effusion.  Shortness of breath. EXAM: CHEST  2 VIEW COMPARISON:  08/31/2015.  PET-CT 11/19/2014 FINDINGS: Unchanged right/right upper lobe hilar mass. Previously identified be PET positive. No interim change from prior exam. Persistent atelectasis right lower lobe. No pleural effusion or pneumothorax. Stable cardiomegaly. Diffuse osteopenia. No acute bony abnormality. IMPRESSION: Persistent unchanged right hilar/right upper lobe mass. Right lower lobe atelectatic changes. Exam stable from prior exam. Electronically Signed   By: [place]   On: 09/02/2015 08:17   Dg Chest 2 View  08/31/2015  CLINICAL DATA:  Shortness of breath. Found down on floor at assisted living  facility. Atrial fibrillation. EXAM: CHEST  2 VIEW COMPARISON:  Multiple exams, including 08/08/2015 FINDINGS: 6.7 by 7.7 right mid lung mass, similar to 4/20 2/17, shown previously to be hypermetabolic compatible with malignancy. Thoracic spondylosis. Atherosclerotic aortic arch. Borderline enlargement of the cardiopericardial silhouette. Small bilateral pleural effusions are new. Indistinct left upper lobe pulmonary nodule. IMPRESSION: 1. Small new bilateral pleural effusions. 2. Stable right hilar mass, about 7.8 cm in diameter, compatible with malignancy. Indistinct left upper lobe pulmonary nodule. Electronically Signed   By: Van Clines M.D.   On: 08/31/2015 17:55   Dg Pelvis Portable  08/08/2015  CLINICAL DATA:  Postop anterior approach hemiarthroplasty EXAM: PORTABLE PELVIS 1-2 VIEWS COMPARISON:  Left hip radiographs dated 08/08/2015 at 1620 hours FINDINGS: Left hip hemiarthroplasty in satisfactory position. Associated subcutaneous gas. No fracture or dislocation is seen. Visualized bony pelvis appears intact. Mild degenerative changes of the right hip. IMPRESSION: Left hip hemiarthroplasty in satisfactory position. Electronically Signed   By: Julian Hy  M.D.   On: 08/08/2015 23:54   Nm Pulmonary Perf And Vent  09/01/2015  CLINICAL DATA:  82 year old male with acute respiratory failure and shortness of breath. Recent left hip arthroplasty. Known metastatic right lung cancer. EXAM: NUCLEAR MEDICINE VENTILATION - PERFUSION LUNG SCAN TECHNIQUE: Ventilation images were obtained in multiple projections using inhaled aerosol Tc-68mDTPA. Perfusion images were obtained in multiple projections after intravenous injection of Tc-94mAA. RADIOPHARMACEUTICALS:  30.0 mCi Technetium-9959mPA aerosol inhalation and 4.0 mCi Technetium-30m62m IV COMPARISON:  None. FINDINGS: Ventilation: Decreased ventilation to the right lung identified, likely due to known central right lung malignancy. Perfusion: No  wedge shaped peripheral perfusion defects to suggest acute pulmonary embolism. Slightly decreased perfusion to the right lung noted. IMPRESSION: Very low probability of pulmonary embolus (less than 10%) Electronically Signed   By: JeffMargarette [place].   On: 09/01/2015 15:25   Dg Chest Port 1 View  08/08/2015  CLINICAL DATA:  Known left hip fracture, recent fall EXAM: PORTABLE CHEST 1 VIEW COMPARISON:  11/19/2014 FINDINGS: Cardiac shadow is within normal limits. Aortic calcifications are noted. Right hilar mass lesion is noted consistent with the patient's known lung carcinoma. No new focal mass lesion is seen. No infiltrate is noted. Degenerative changes of thoracic spine are seen. IMPRESSION: Changes consistent with the known history of lung carcinoma. No acute abnormality is noted. Electronically Signed   By: MarkInez [place]ina.   On: 08/08/2015 17:30   Dg C-arm 1-60 Min-no Report  08/08/2015  CLINICAL DATA: perioperative use of fluoro for hip fracture repair C-ARM 1-60 MINUTES Fluoroscopy was utilized by the requesting physician.  No radiographic interpretation.   Dg Hip Operative Unilat W Or W/o Pelvis Left  08/08/2015  CLINICAL DATA:  New left hip arthroplasty EXAM: OPERATIVE LEFT HIP (WITH PELVIS IF PERFORMED)  VIEWS TECHNIQUE: Fluoroscopic spot image(s) were submitted for interpretation post-operatively. FLUOROSCOPY TIME:  21.8 seconds COMPARISON:  Left hip radiographs dated 08/08/2015 FINDINGS: Intraoperative fluoroscopic images during left hip hemiarthroplasty. Surgical hardware in anatomic position. IMPRESSION: Intraoperative fluoroscopic images during left hip hemiarthroplasty. Electronically Signed   By: SriyJulian Hy.   On: 08/08/2015 23:22   Dg Hip Unilat With Pelvis 2-3 Views Left  08/08/2015  CLINICAL DATA:  Fall today with left hip pain EXAM: DG HIP (WITH OR WITHOUT PELVIS) 2-3V LEFT COMPARISON:  None. FINDINGS: Nondisplaced comminuted impacted left mid femoral neck fracture with mild  apex lateral angulation. No additional fracture. No hip dislocation. Mild-to-moderate osteoarthritis in the weight-bearing portions of both hip joints. Marked degenerative changes in the visualized lower lumbar spine. IMPRESSION: Nondisplaced comminuted impacted left mid femoral neck fracture. Electronically Signed   By: JasoIlona Sorrel.   On: 08/08/2015 16:37    Microbiology: Recent Results (from the past 240 hour(s))  MRSA PCR Screening     Status: None   Collection Time: 09/01/15  1:00 AM  Result Value Ref Range Status   MRSA by PCR NEGATIVE NEGATIVE Final    Comment:        The GeneXpert MRSA Assay (FDA approved for NASAL specimens only), is one component of a comprehensive MRSA colonization surveillance program. It is not intended to diagnose MRSA infection nor to guide or monitor treatment for MRSA infections.      Labs: Basic Metabolic Panel:  Recent Labs Lab 08/31/15 1734 09/01/15 0338 09/02/15 0515 09/03/15 0504 09/03/15 1448 09/04/15 0517  NA 146* 149* 147* 146*  --  146*  K 3.2* 2.6* 3.2* 3.0*  --  3.7  CL 110 108 110 104  --  104  CO2 24 28 28 30   --  31  GLUCOSE 95 129* 108* 188*  --  141*  BUN 29* 29* 28* 28*  --  35*  CREATININE 1.37* 1.33* 1.15 1.20  --  1.14  CALCIUM 8.0* 8.0* 7.8* 7.8*  --  8.2*  MG  --   --   --   --  1.9  --    Liver Function Tests:  Recent Labs Lab 08/31/15 1734 09/01/15 0338 09/02/15 0515  AST 22 18 17   ALT 14* 13* 11*  ALKPHOS 97 84 71  BILITOT 2.0* 2.2* 1.2  PROT 5.3* 5.5* 4.6*  ALBUMIN 2.7* 2.5* 2.1*   No results for input(s): LIPASE, AMYLASE in the last 168 hours. No results for input(s): AMMONIA in the last 168 hours. CBC:  Recent Labs Lab 08/31/15 1734 09/01/15 0338 09/02/15 0515  WBC 11.7* 10.2 7.3  NEUTROABS 9.6* 8.1*  --   HGB 10.2* 9.5* 8.9*  HCT 33.3* 31.1* 29.1*  MCV 95.7 95.7 94.2  PLT 217 205 191   Cardiac Enzymes:  Recent Labs Lab 08/31/15 2152 09/01/15 0338 09/01/15 0758  TROPONINI  <0.03 0.03 0.03   BNP: BNP (last 3 results)  Recent Labs  08/31/15 1734  BNP 434.7*        Signed:  Oswald Hillock MD.  [facility] 09/04/2015, 10:38 AM

## 2015-09-17 DEATH — deceased

## 2016-04-09 IMAGING — PT NM PET TUM IMG INITIAL (PI) SKULL BASE T - THIGH
1 of 7 series · 1 of 25 positions shown · non-contrast
Comparison: None

CLINICAL DATA: Initial Treatment strategy for lung mass.

EXAM:
NUCLEAR MEDICINE PET SKULL BASE TO THIGH
TECHNIQUE: 9.2 mCi F-18 FDG was injected intravenously. Full-ring PET imaging
was performed from the skull base to thigh after the radiotracer. CT
data was obtained and used for attenuation correction and anatomic
localization.
FASTING BLOOD GLUCOSE:  Value: 78 mg/dl

[Series 4: ct sk_thigh 5.0 hd_fov · axial · 5.0mm · 1.07mm/px · 1 of 230 slices shown]
[im 230/230  brain]
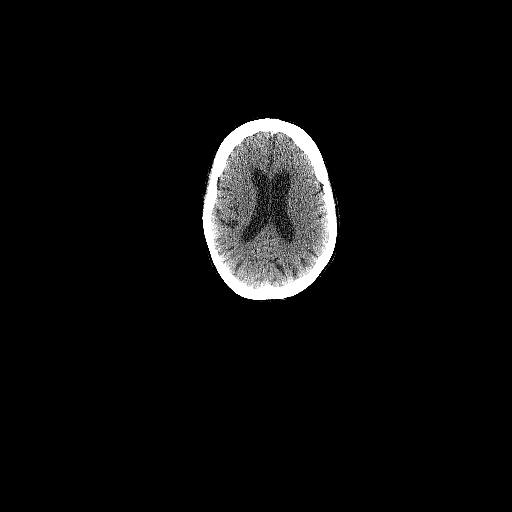

[1 of 25 positions shown; findings below may reference images not displayed]

FINDINGS: NECK

No hypermetabolic lymph nodes in the neck.

CHEST

Central right lung perihilar mass has a maximum dimension of 6 cm
and has an SUV max equal to 13.5. This has a halo of a ground-glass
attenuation suggesting pulmonary adenocarcinoma. Multiple small, sub
solid and nodules are scattered throughout the right lung which are
worrisome for areas of metastatic disease. Index nodule in the right
upper lobe measures 1 cm, image 32/ series 8. Index nodule in the
right lower lobe posteriorly measures 7 mm, image 59/series 8. In
the left upper lobe there is a 2.2 cm nodule which has an SUV max
equal to 1.69. No hypermetabolic mediastinal or left hilar lymph
nodes. No hypermetabolic supraclavicular adenopathy.

ABDOMEN/PELVIS

Nodule in the right adrenal gland measures 2 cm and has an SUV max
equal to 11.6. There is no abnormal uptake within the liver or
spleen. The adrenal glands are unremarkable. Aortic atherosclerosis
is noted. The abdominal aorta has an AP dimension measuring 2.8 cm.

SKELETON

Within the posterior elements of the L2 vertebra there is a
hypermetabolic lytic lesion measure 1.8 cm within SUV max equal to
7.56.
IMPRESSION: 1. Central right midlung mass is intensely hypermetabolic compatible
with primary bronchogenic carcinoma. Assuming a non-small still
histology this would be consistent with a [id number] or stage IV
disease.
2. Bilateral pulmonary metastasis
3. Right adrenal gland metastasis
4. Lytic metastasis to the L2 vertebra.

## 2016-08-29 NOTE — Progress Notes (Signed)
This encounter was created in error - please disregard.
